# Patient Record
Sex: Female | Born: 1945
Health system: Southern US, Community
[De-identification: ages and names within clinical notes are randomized; demographics above are authoritative.]

## PROBLEM LIST (undated history)

## (undated) DIAGNOSIS — K5904 Chronic idiopathic constipation: Secondary | ICD-10-CM

## (undated) HISTORY — PX: TONSILLECTOMY: SUR1361

## (undated) HISTORY — PX: CHOLECYSTECTOMY: SHX55

## (undated) HISTORY — PX: VAGINAL HYSTERECTOMY: SUR661

## (undated) NOTE — Progress Notes (Signed)
 Formatting of this note might be different from the original. SUBJECTIVE:  Kaylee Costa is a 55 year old female who complains of sinus and nasal congestion, sore throat, nasal blockage, post nasal drip and bilateral sinus pain for 7 days, worse on the right. She had URI symptoms when she flew here from [place]  last week and has had increasing facial pain and ear pain since. She denies a history of wheezing and shortness of breath and denies a history of asthma. Patient denies smoking cigarettes. Concerned about flying again in a few days.  PMH: negative for significantly.  PSH: vag hysterectomy, cholecystectomy, tonsillectomy.  Comprehensive review of systems is negative except as noted in HPI.   OBJECTIVE: Pulse 88  Temp(Src) 99 F (37.2 C) (Temporal)  SpO2 97%  She appears well.  Ears normal.  Throat and pharynx normal.  Neck supple. No adenopathy in the neck. Nose is congested. Sinuses with right maxillary tenderness. The chest is clear, without wheezes or rales.  ASSESSMENT:  (J01.00) Acute maxillary sinusitis, recurrence not specified  (primary encounter diagnosis Plan: amoxicillin-clavulanate (AUGMENTIN) 875-125 mg        tablet, Benzonatate (TESSALON) 200 mg capsule  Discussed saline irrigation, decongestants for flying to help equalize pressure in the sinuses and ears. Electronically signed by Rod Ozell DELENA, PA-C at 12/29/2014  4:01 PM PST

---

## 2011-03-24 DIAGNOSIS — Z85828 Personal history of other malignant neoplasm of skin: Secondary | ICD-10-CM | POA: Insufficient documentation

## 2012-03-02 ENCOUNTER — Emergency Department (INDEPENDENT_AMBULATORY_CARE_PROVIDER_SITE_OTHER): Payer: Worker's Compensation

## 2012-03-02 ENCOUNTER — Encounter (HOSPITAL_BASED_OUTPATIENT_CLINIC_OR_DEPARTMENT_OTHER): Payer: Self-pay | Admitting: Emergency Medicine

## 2012-03-02 ENCOUNTER — Emergency Department (HOSPITAL_BASED_OUTPATIENT_CLINIC_OR_DEPARTMENT_OTHER)
Admission: EM | Admit: 2012-03-02 | Discharge: 2012-03-02 | Disposition: A | Discharge status: Home / Self Care | Payer: Worker's Compensation | Attending: Emergency Medicine | Admitting: Emergency Medicine

## 2012-03-02 DIAGNOSIS — M25519 Pain in unspecified shoulder: Secondary | ICD-10-CM

## 2012-03-02 DIAGNOSIS — M25559 Pain in unspecified hip: Secondary | ICD-10-CM

## 2012-03-02 DIAGNOSIS — M949 Disorder of cartilage, unspecified: Secondary | ICD-10-CM

## 2012-03-02 DIAGNOSIS — S7000XA Contusion of unspecified hip, initial encounter: Secondary | ICD-10-CM | POA: Insufficient documentation

## 2012-03-02 DIAGNOSIS — W010XXA Fall on same level from slipping, tripping and stumbling without subsequent striking against object, initial encounter: Secondary | ICD-10-CM | POA: Insufficient documentation

## 2012-03-02 DIAGNOSIS — W19XXXA Unspecified fall, initial encounter: Secondary | ICD-10-CM

## 2012-03-02 DIAGNOSIS — M899 Disorder of bone, unspecified: Secondary | ICD-10-CM

## 2012-03-02 DIAGNOSIS — S40019A Contusion of unspecified shoulder, initial encounter: Secondary | ICD-10-CM

## 2012-03-02 NOTE — ED Notes (Signed)
Pt was at work yesterday afternoon when she stepped backward catching her foot on carpet, lost her balance and fell.  Landed on right side injuring her right shoulder and right hip.  Finished out the day working. Increased pain today.

## 2012-03-02 NOTE — ED Provider Notes (Signed)
History     CSN: [id number]  Arrival date & time 03/02/12  1750   First MD Initiated Contact with Patient 03/02/12 1856      Chief Complaint  Patient presents with  . Fall    (Consider location/radiation/quality/duration/timing/severity/associated sxs/prior treatment) Patient is a 66 y.o. female presenting with fall. The history is provided by the patient.  Fall The accident occurred yesterday. The fall occurred while standing (at work). She fell from a height of 1 to 2 ft. She landed on a hard floor. There was no blood loss. The point of impact was the right shoulder and right hip. The pain is present in the right shoulder and right hip. The pain is moderate. She was ambulatory at the scene. The symptoms are aggravated by activity, standing, ambulation and rotation. She has tried nothing for the symptoms.    History reviewed. No pertinent past medical history.  Past Surgical History  Procedure Date  . Vaginal hysterectomy   . Cholecystectomy   . Tonsillectomy     No family history on file.  History  Substance Use Topics  . Smoking status: Former Games developer  . Smokeless tobacco: Not on file  . Alcohol Use: Yes     socially    OB History    Grav Para Term Preterm Abortions TAB SAB Ect Mult Living                  Review of Systems  All other systems reviewed and are negative.    Allergies  Morphine and related and Vicodin  Home Medications   Current Outpatient Rx  Name Route Sig Dispense Refill  . IBUPROFEN 200 MG PO TABS Oral Take 200 mg by mouth every 6 (six) hours as needed.      BP 149/70  Pulse 72  Temp(Src) 97.8 F (36.6 C) (Oral)  Resp 16  Ht 5\' 8"  (1.727 m)  Wt 154 lb (69.854 kg)  BMI 23.42 kg/m2  SpO2 99%  Physical Exam  Nursing note and vitals reviewed. Constitutional: She is oriented to person, place, and time. She appears well-developed and well-nourished. No distress.  HENT:  Head: Normocephalic and atraumatic.  Neck: Normal range of  motion. Neck supple.  Musculoskeletal:       The right shoulder appears grossly normal.  There is ttp over the lateral aspect.  Pain is with abduction and internal rotation.  The RUE is intact neurovascularly.    There is an ecchymotic area over the lateral aspect of the right hip, but otherwise appears okay.  Neurovasc intact and no shortening or external rotation.  Neurological: She is alert and oriented to person, place, and time.  Skin: Skin is warm and dry. She is not diaphoretic.    ED Course  Procedures (including critical care time)  Labs Reviewed - No data to display No results found.   No diagnosis found.    MDM          Geoffery Lyons, MD 03/02/12 [phone]

## 2012-03-02 NOTE — ED Notes (Signed)
C/o pain in right shoulder and right upper thigh. Patient put ice on leg which reduced the swelling. Increased pain today, pt states that she has decreased range of motion in her shoulder.

## 2012-03-02 NOTE — Discharge Instructions (Signed)

## 2013-11-29 DIAGNOSIS — K5909 Other constipation: Secondary | ICD-10-CM | POA: Insufficient documentation

## 2016-03-01 DIAGNOSIS — R200 Anesthesia of skin: Secondary | ICD-10-CM | POA: Insufficient documentation

## 2016-03-01 DIAGNOSIS — R4182 Altered mental status, unspecified: Secondary | ICD-10-CM | POA: Insufficient documentation

## 2016-03-01 DIAGNOSIS — M5417 Radiculopathy, lumbosacral region: Secondary | ICD-10-CM | POA: Insufficient documentation

## 2016-04-21 DIAGNOSIS — S32040D Wedge compression fracture of fourth lumbar vertebra, subsequent encounter for fracture with routine healing: Secondary | ICD-10-CM | POA: Insufficient documentation

## 2016-04-21 DIAGNOSIS — M5416 Radiculopathy, lumbar region: Secondary | ICD-10-CM | POA: Insufficient documentation

## 2016-12-13 DIAGNOSIS — L409 Psoriasis, unspecified: Secondary | ICD-10-CM | POA: Insufficient documentation

## 2018-01-22 ENCOUNTER — Emergency Department (HOSPITAL_BASED_OUTPATIENT_CLINIC_OR_DEPARTMENT_OTHER): Payer: Medicare Other

## 2018-01-22 ENCOUNTER — Encounter (HOSPITAL_BASED_OUTPATIENT_CLINIC_OR_DEPARTMENT_OTHER): Payer: Self-pay | Admitting: Emergency Medicine

## 2018-01-22 ENCOUNTER — Emergency Department (HOSPITAL_BASED_OUTPATIENT_CLINIC_OR_DEPARTMENT_OTHER)
Admission: EM | Admit: 2018-01-22 | Discharge: 2018-01-22 | Disposition: A | Discharge status: Home / Self Care | Payer: Medicare Other | Attending: Emergency Medicine | Admitting: Emergency Medicine

## 2018-01-22 ENCOUNTER — Other Ambulatory Visit: Payer: Self-pay

## 2018-01-22 DIAGNOSIS — Z79899 Other long term (current) drug therapy: Secondary | ICD-10-CM | POA: Insufficient documentation

## 2018-01-22 DIAGNOSIS — R0602 Shortness of breath: Secondary | ICD-10-CM | POA: Insufficient documentation

## 2018-01-22 DIAGNOSIS — R42 Dizziness and giddiness: Secondary | ICD-10-CM | POA: Diagnosis not present

## 2018-01-22 DIAGNOSIS — Z87891 Personal history of nicotine dependence: Secondary | ICD-10-CM | POA: Insufficient documentation

## 2018-01-22 DIAGNOSIS — R0789 Other chest pain: Secondary | ICD-10-CM

## 2018-01-22 HISTORY — DX: Chronic idiopathic constipation: K59.04

## 2018-01-22 LAB — COMPREHENSIVE METABOLIC PANEL
ALBUMIN: 3.6 g/dL (ref 3.5–5.0)
ALT: 15 U/L (ref 14–54)
ANION GAP: 7 (ref 5–15)
AST: 23 U/L (ref 15–41)
Alkaline Phosphatase: 70 U/L (ref 38–126)
BUN: 16 mg/dL (ref 6–20)
CO2: 28 mmol/L (ref 22–32)
Calcium: 9 mg/dL (ref 8.9–10.3)
Chloride: 106 mmol/L (ref 101–111)
Creatinine, Ser: 0.6 mg/dL (ref 0.44–1.00)
GFR calc non Af Amer: >60 mL/min (ref >60)
GLUCOSE: 89 mg/dL (ref 65–99)
POTASSIUM: 4.4 mmol/L (ref 3.5–5.1)
Sodium: 141 mmol/L (ref 135–145)
TOTAL PROTEIN: 6.4 g/dL — AB (ref 6.5–8.1)
Total Bilirubin: 0.4 mg/dL (ref 0.3–1.2)

## 2018-01-22 LAB — CBC WITH DIFFERENTIAL/PLATELET
BASOS ABS: 0 10*3/uL (ref 0.0–0.1)
Basophils Relative: 0 %
EOS PCT: 1 %
Eosinophils Absolute: 0.1 10*3/uL (ref 0.0–0.7)
HCT: 40.7 % (ref 36.0–46.0)
Hemoglobin: 13.4 g/dL (ref 12.0–15.0)
LYMPHS PCT: 13 %
Lymphs Abs: 0.9 10*3/uL (ref 0.7–4.0)
MCH: 31.6 pg (ref 26.0–34.0)
MCHC: 32.9 g/dL (ref 30.0–36.0)
MCV: 96 fL (ref 78.0–100.0)
MONO ABS: 0.6 10*3/uL (ref 0.1–1.0)
MONOS PCT: 9 %
Neutro Abs: 5.9 10*3/uL (ref 1.7–7.7)
Neutrophils Relative %: 77 %
PLATELETS: 261 10*3/uL (ref 150–400)
RBC: 4.24 MIL/uL (ref 3.87–5.11)
RDW: 12.5 % (ref 11.5–15.5)
WBC: 7.6 10*3/uL (ref 4.0–10.5)

## 2018-01-22 LAB — D-DIMER, QUANTITATIVE: D-Dimer, Quant: 0.97 ug/mL-FEU — ABNORMAL HIGH (ref 0.00–0.50)

## 2018-01-22 MED ORDER — IOPAMIDOL (ISOVUE-370) INJECTION 76%
100.0000 mL | Freq: Once | INTRAVENOUS | Status: AC | PRN
  Administered 2018-01-22: 100 mL via INTRAVENOUS

## 2018-01-22 NOTE — ED Notes (Signed)
Patient transported to X-ray 

## 2018-01-22 NOTE — ED Provider Notes (Signed)
[facility] EMERGENCY DEPARTMENT Provider Note   CSN: [id number] Arrival date & time: 01/22/18  1050     History   Chief Complaint Chief Complaint  Patient presents with  . Chest Pain    HPI Kaylee Costa is a 72 y.o. female.  The history is provided by the patient.  Chest Pain   This is a new problem. Episode onset: 6 days. The problem occurs constantly. The problem has not changed since onset.Associated with: worse with movement, deep breathing, laughing, coughing. The pain is present in the lateral region (right lower and lateral ribs). The pain is at a severity of 8/10. The pain is severe. The quality of the pain is described as pleuritic and sharp. The pain does not radiate. Duration of episode(s) is 6 days. The symptoms are aggravated by certain positions and deep breathing. Associated symptoms include dizziness and shortness of breath. Pertinent negatives include no abdominal pain, no back pain, no cough, no diaphoresis, no exertional chest pressure, no fever, no headaches, no irregular heartbeat, no leg pain, no lower extremity edema, no nausea, no orthopnea, no palpitations, no sputum production, no vomiting and no weakness. She has tried rest (NSAIDS) for the symptoms. The treatment provided no relief. There are no known risk factors. Past medical history comments: no significant PMH.  no recent travel and no hormone therapy    Past Medical History:  Diagnosis Date  . Chronic idiopathic constipation     There are no active problems to display for this patient.   Past Surgical History:  Procedure Laterality Date  . CHOLECYSTECTOMY    . TONSILLECTOMY    . VAGINAL HYSTERECTOMY      OB History    No data available       Home Medications    Prior to Admission medications   Medication Sig Start Date End Date Taking? Authorizing Provider  linaclotide (LINZESS) 145 MCG CAPS capsule Take 145 mcg by mouth daily before breakfast.   Yes [provider]  ibuprofen (ADVIL,MOTRIN) 200 MG tablet Take 200 mg by mouth every 6 (six) hours as needed. Patient used this medication for the pain in her shoulder.    [provider]    Family History No family history on file.  Social History Social History   Tobacco Use  . Smoking status: Former Games developer  . Smokeless tobacco: Never Used  Substance Use Topics  . Alcohol use: Yes    Comment: socially  . Drug use: No     Allergies   Morphine and related and Vicodin [hydrocodone-acetaminophen]   Review of Systems Review of Systems  Constitutional: Negative for diaphoresis and fever.  Respiratory: Positive for shortness of breath. Negative for cough and sputum production.   Cardiovascular: Positive for chest pain. Negative for palpitations and orthopnea.  Gastrointestinal: Negative for abdominal pain, nausea and vomiting.  Musculoskeletal: Negative for back pain.  Neurological: Positive for dizziness. Negative for weakness and headaches.  All other systems reviewed and are negative.    Physical Exam Updated Vital Signs BP 134/80   Pulse 70   Temp 97.9 F (36.6 C) (Oral)   Resp 16   Ht 5\' 7"  (1.702 m)   Wt 68.9 kg (152 lb)   SpO2 98%   BMI 23.81 kg/m   Physical Exam  Constitutional: She is oriented to person, place, and time. She appears well-developed and well-nourished. No distress.  HENT:  Head: Normocephalic and atraumatic.  Mouth/Throat: Oropharynx is clear and moist.  Eyes:  Conjunctivae and EOM are normal. Pupils are equal, round, and reactive to light.  Neck: Normal range of motion. Neck supple.  Cardiovascular: Normal rate, regular rhythm and intact distal pulses.  No murmur heard. Pulmonary/Chest: Effort normal. No respiratory distress. She has decreased breath sounds. She has no wheezes. She has no rales. She exhibits tenderness and bony tenderness. She exhibits no crepitus and no deformity.  Decreased breath sounds throughout due to not taking deep  breaths due to pain    Abdominal: Soft. She exhibits no distension. There is no tenderness. There is no rebound and no guarding.  Musculoskeletal: Normal range of motion. She exhibits no edema or tenderness.  Neurological: She is alert and oriented to person, place, and time.  Skin: Skin is warm and dry. No rash noted. No erythema.  Psychiatric: She has a normal mood and affect. Her behavior is normal.  Nursing note and vitals reviewed.    ED Treatments / Results  Labs (all labs ordered are listed, but only abnormal results are displayed) Labs Reviewed  D-DIMER, QUANTITATIVE (NOT AT [facility]) - Abnormal; Notable for the following [facility]:      Result Value   D-Dimer, Quant 0.97 (*)    All other components within normal limits  COMPREHENSIVE METABOLIC PANEL - Abnormal; Notable for the following components:   Total Protein 6.4 (*)    All other components within normal limits  CBC WITH DIFFERENTIAL/PLATELET    EKG  EKG Interpretation  Date/Time:  Sunday January 22 2018 11:04:17 EDT Ventricular Rate:  73 PR Interval:    QRS Duration: 97 QT Interval:  403 QTC Calculation: 445 R Axis:   74 Text Interpretation:  Sinus rhythm Minimal ST depression, inferior leads Baseline wander in lead(s) I II III aVR aVL aVF No previous tracing Confirmed by Gwyneth SproutPlunkett, Reinhold Rickey ([id number]) on 01/22/2018 11:14:22 AM       Radiology Dg Chest 2 View  Result Date: 01/22/2018 CLINICAL DATA:  Chest pain EXAM: CHEST - 2 VIEW COMPARISON:  None. FINDINGS: Left base atelectasis. Right lung clear. Heart is normal size. No effusions or acute bony abnormality. IMPRESSION: Left base atelectasis. Electronically Signed   By: Charlett NoseKevin  Dover M.D.   On: 01/22/2018 11:23   Ct Angio Chest Pe W And/or Wo Contrast  Result Date: 01/22/2018 CLINICAL DATA:  Intermediate probability for pulmonary embolism. Right rib pain. Elevated D-dimer. EXAM: CT ANGIOGRAPHY CHEST WITH CONTRAST TECHNIQUE: Multidetector CT imaging of the chest  was performed using the standard protocol during bolus administration of intravenous contrast. Multiplanar CT image reconstructions and MIPs were obtained to evaluate the vascular anatomy. CONTRAST:  100mL ISOVUE-370 IOPAMIDOL (ISOVUE-370) INJECTION 76% COMPARISON:  None. FINDINGS: Cardiovascular: Satisfactory opacification of the pulmonary arteries to the segmental level. No evidence of pulmonary embolism. Normal heart size. No pericardial effusion. Mediastinum/Nodes: Negative for adenopathy or mass Lungs/Pleura: Mild atelectasis at the bases. There is no edema, consolidation, effusion, or pneumothorax. Upper Abdomen: Punctate stone in the upper pole left kidney. Musculoskeletal: No acute or aggressive finding Review of the MIP images confirms the above findings. IMPRESSION: 1. Negative for pulmonary embolism. 2. Mild atelectasis at the bases. 3. Punctate left renal calculus. Electronically Signed   By: Marnee SpringJonathon  Watts M.D.   On: 01/22/2018 14:02    Procedures Procedures (including critical care time)  Medications Ordered in ED Medications - No data to display   Initial Impression / Assessment and Plan / ED Course  I have reviewed the triage vital signs and the nursing notes.  Pertinent  labs & imaging results that were available during my care of the patient were reviewed by me and considered in my medical decision making (see chart for details).     Patient is a 72 year old female with no significant past medical history presenting today with pleuritic type sharp chest pain that is in the right lateral and lower ribs.  It is worse with coughing, deep breathing, laughing and movements.  She does not associate any pain with eating she is not been systemically ill and denies cough, fever.  She is having some shortness of breath which will occasionally make her feel dizzy.  She denies any new medications.  She cannot recall any specific trauma and she has not noted any rashes.  There is no evidence of  zoster on exam.  Breath sounds are decreased throughout but feel that is due to effort.  Vital signs are reassuring and oxygen saturation is 98% on room air.  Patient's EKG with nonspecific changes and no old to compare however low suspicion this is ACS or dissection.  He has no right upper quadrant tenderness or concern for cholecystitis at this time.  Patient is low risk [place] so we will do a d-dimer.  Chest x-ray without evidence of cardiomegaly, pneumothorax but she does have some atelectasis. CBC, CMP, d-dimer pending.  Patient offered pain control and she refused.  2:18 PM D-dimer was elevated and rest of labs within normal limits.  CT was negative for PE.  Patient was discharged home with musculoskeletal pain  Final Clinical Impressions(s) / ED Diagnoses   Final diagnoses:  Chest wall pain    ED Discharge Orders    None       Gwyneth Sprout, MD 01/22/18 1419

## 2018-01-22 NOTE — ED Notes (Signed)
Patient in CT

## 2018-01-22 NOTE — ED Notes (Signed)
Water given with verbal approval from Dr. Tanna SavoyPlunket

## 2018-01-22 NOTE — ED Triage Notes (Signed)
R side chest/rib pain x 1 week. Pain increases with movement and inspiration.

## 2018-03-09 DIAGNOSIS — I781 Nevus, non-neoplastic: Secondary | ICD-10-CM | POA: Insufficient documentation

## 2018-03-27 DIAGNOSIS — E559 Vitamin D deficiency, unspecified: Secondary | ICD-10-CM | POA: Insufficient documentation

## 2018-04-12 ENCOUNTER — Emergency Department (HOSPITAL_BASED_OUTPATIENT_CLINIC_OR_DEPARTMENT_OTHER): Payer: Medicare Other

## 2018-04-12 ENCOUNTER — Encounter (HOSPITAL_BASED_OUTPATIENT_CLINIC_OR_DEPARTMENT_OTHER): Payer: Self-pay | Admitting: Adult Health

## 2018-04-12 ENCOUNTER — Emergency Department (HOSPITAL_BASED_OUTPATIENT_CLINIC_OR_DEPARTMENT_OTHER)
Admission: EM | Admit: 2018-04-12 | Discharge: 2018-04-12 | Disposition: A | Discharge status: Home / Self Care | Payer: Medicare Other | Attending: Emergency Medicine | Admitting: Emergency Medicine

## 2018-04-12 ENCOUNTER — Other Ambulatory Visit: Payer: Self-pay

## 2018-04-12 DIAGNOSIS — Z87891 Personal history of nicotine dependence: Secondary | ICD-10-CM | POA: Diagnosis not present

## 2018-04-12 DIAGNOSIS — Z79899 Other long term (current) drug therapy: Secondary | ICD-10-CM | POA: Insufficient documentation

## 2018-04-12 DIAGNOSIS — M25551 Pain in right hip: Secondary | ICD-10-CM

## 2018-04-12 DIAGNOSIS — R52 Pain, unspecified: Secondary | ICD-10-CM

## 2018-04-12 NOTE — Discharge Instructions (Signed)
Imaging shows no signs of hip fracture.  This could be a possible tendon injury or labral tear.  Would recommend following with orthopedics.  Use crutches for weightbearing as tolerated.  May use lidocaine patches.  Recommend taking anti-inflammatories.  Perform range of motion.  It is very important for you to follow-up with orthopedics.  Return to ED if you develop any loss of bowel or bladder, not able to urinate, numbness in her vaginal area, worsening pain.

## 2018-04-12 NOTE — ED Triage Notes (Signed)
Presents with right hip pain. This morning she was playing golf and in md swing heard a pop and had onset of r hip pain. She is ambulatory, alternating heat and ice and took 3 ibuprofen.

## 2018-04-12 NOTE — ED Provider Notes (Signed)
[facility] EMERGENCY DEPARTMENT Provider Note   CSN: [id number] Arrival date & time: 04/12/18  1636     History   Chief Complaint Chief Complaint  Patient presents with  . Hip Pain    HPI Kaylee Costa is a 72 y.o. female.  HPI  72 year old female with medical history significant for osteopenia presents to the emergency department today with complaints of right hip pain.  Patient states that she was playing golf this morning when she went to make a swing and had immediate pain with a pop in her right hip.  Patient states that she had to "limp" back to the golf cart.  She has been able to weight-bear however ambulation does cause her significant pain.  Pain is worse with ambulation and palpation.  She took 3 Advil this morning.  States that sitting down and not moving makes the pain better.  Denies any associated paresthesias.  Denies any weakness.  She denies any red flag symptoms including history of IV drug use, history of cancer, saddle paresthesias, loss of bowel bladder, urinary retention or lower extremity paresthesias.  Denies any associated back pain.  Pain is localized to the right hip area.     Past Medical History:  Diagnosis Date  . Chronic idiopathic constipation     There are no active problems to display for this patient.   Past Surgical History:  Procedure Laterality Date  . CHOLECYSTECTOMY    . TONSILLECTOMY    . VAGINAL HYSTERECTOMY       OB History   None      Home Medications    Prior to Admission medications   Medication Sig Start Date End Date Taking? Authorizing Provider  ibuprofen (ADVIL,MOTRIN) 200 MG tablet Take 200 mg by mouth every 6 (six) hours as needed. Patient used this medication for the pain in her shoulder.    [provider]  linaclotide (LINZESS) 145 MCG CAPS capsule Take 145 mcg by mouth daily before breakfast.    [provider]    Family History History reviewed. No pertinent family  history.  Social History Social History   Tobacco Use  . Smoking status: Former Games developer  . Smokeless tobacco: Never Used  Substance Use Topics  . Alcohol use: Yes    Comment: socially  . Drug use: No     Allergies   Morphine and related and Vicodin [hydrocodone-acetaminophen]   Review of Systems Review of Systems  All other systems reviewed and are negative.    Physical Exam Updated Vital Signs BP (!) 147/80   Pulse 76   Temp 98.5 F (36.9 C) (Oral)   Resp 18   SpO2 95%   Physical Exam  Constitutional: She appears well-developed and well-nourished. No distress.  HENT:  Head: Normocephalic and atraumatic.  Eyes: Right eye exhibits no discharge. Left eye exhibits no discharge. No scleral icterus.  Neck: Normal range of motion.  Pulmonary/Chest: No respiratory distress.  Musculoskeletal: Normal range of motion.       Right hip: She exhibits tenderness and bony tenderness. She exhibits normal range of motion, normal strength, no swelling, no crepitus, no deformity and no laceration.       Legs: There is no lower extremity rotation or shortening.  She does have some mild pain with logroll the right leg.  Patient has full range of motion with passive and active range of motion of the right hip and right knee.  No clicking sound made with rotation of the right  hip.  No obvious deformity noted.  She has no tenderness along the lumbar spine.  No significant paraspinal tenderness.  Skin compartments are soft.  DP pulses are 2+ bilaterally.  Sensation intact.  Brisk cap refill.  Neurological: She is alert.  Skin: Skin is warm and dry. Capillary refill takes less than 2 seconds. No pallor.  Psychiatric: Her behavior is normal. Judgment and thought content normal.  Nursing note and vitals reviewed.    ED Treatments / Results  Labs (all labs ordered are listed, but only abnormal results are displayed) Labs Reviewed - No data to display  EKG None  Radiology Ct Hip Right  Wo Contrast  Result Date: 04/12/2018 CLINICAL DATA:  Right hip pain.  Heard a pop while playing golf. EXAM: CT OF THE RIGHT HIP WITHOUT CONTRAST TECHNIQUE: Multidetector CT imaging of the right hip was performed according to the standard protocol. Multiplanar CT image reconstructions were also generated. COMPARISON:  None. FINDINGS: Bones/Joint/Cartilage No fracture or dislocation. Normal alignment. No joint effusion. Mild osteoarthritis of right SI joint. Severe degenerative disc disease with disc height loss at L5-S1. 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 Ligaments Ligaments are suboptimally evaluated by CT. Muscles and Tendons Muscles are normal. No muscle atrophy. No intramuscular fluid collection or hematoma. Soft tissue No fluid collection or hematoma. No soft tissue mass. Diverticulosis without evidence of diverticulitis. Small fat containing umbilical hernia. IMPRESSION: 1.  No acute osseous injury of the right hip. 2. 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 Electronically Signed   By: Elige Ko   On: 04/12/2018 19:37   Dg Hip Unilat With Pelvis 2-3 Views Right  Result Date: 04/12/2018 CLINICAL DATA:  Right hip pain. EXAM: DG HIP (WITH OR WITHOUT PELVIS) 2-3V RIGHT COMPARISON:  None. FINDINGS: There is no evidence of hip fracture or dislocation. There is no evidence of arthropathy or other focal bone abnormality. IMPRESSION: Negative. Electronically Signed   By: Elige Ko   On: 04/12/2018 17:54    Procedures Procedures (including critical care time)  Medications Ordered in ED Medications - No data to display   Initial Impression / Assessment and Plan / ED Course  I have reviewed the triage vital signs and the nursing notes.  Pertinent labs & imaging results that were available during my care of the patient were reviewed by me and considered in my medical decision making (see chart for details).     Presents to the ED for acute onset of right hip pain after feeling a pop  while golfing this morning.  Neurovascularly intact.  She is able to ambulate and stand on her right leg but does cause her some pain.  Patient skin compartments are soft.  The pain is localized to the right hip.  No low back pain.  No sciatic nerve pain.  No red flag symptoms that be concerning for cauda equina.  X-ray of the right hip was reassuring.  Given patient's pain with ambulation CT scan was performed that showed no acute findings.  I suspect possible ligamentous or tendon injury versus labral tear.  Possibly occult fracture however CT scan was reassuring.  Patient does have an orthopedic doctor and will try to see them tomorrow.  Given her crutches for nonweightbearing.  She does not want any pain medication to go home with.  She states that she has ibuprofen and Tylenol at home.  Will use lidocaine patches.  Patient remains neurovascularly intact.  Pt is hemodynamically stable, in NAD, & able to ambulate  in the ED. Evaluation does not show pathology that would require ongoing emergent intervention or inpatient treatment. I explained the diagnosis to the patient. Pain has been managed & has no complaints prior to dc. Pt is comfortable with above plan and is stable for discharge at this time. All questions were answered prior to disposition. Strict return precautions for f/u to the ED were discussed. Encouraged follow up with PCP.  PT was dicussed with Dr. Jacqulyn Bath who is agreeable with the above plan.   Final Clinical Impressions(s) / ED Diagnoses   Final diagnoses:  Right hip pain    ED Discharge Orders    None       Wallace Keller 04/12/18 1946    Long, Arlyss Repress, MD 04/13/18 1334

## 2018-04-13 DIAGNOSIS — G629 Polyneuropathy, unspecified: Secondary | ICD-10-CM | POA: Insufficient documentation

## 2018-04-13 DIAGNOSIS — M810 Age-related osteoporosis without current pathological fracture: Secondary | ICD-10-CM | POA: Insufficient documentation

## 2018-04-13 DIAGNOSIS — M1990 Unspecified osteoarthritis, unspecified site: Secondary | ICD-10-CM | POA: Insufficient documentation

## 2018-04-13 DIAGNOSIS — M7061 Trochanteric bursitis, right hip: Secondary | ICD-10-CM | POA: Insufficient documentation

## 2018-08-06 ENCOUNTER — Emergency Department (HOSPITAL_BASED_OUTPATIENT_CLINIC_OR_DEPARTMENT_OTHER): Payer: Medicare Other

## 2018-08-06 ENCOUNTER — Other Ambulatory Visit: Payer: Self-pay

## 2018-08-06 ENCOUNTER — Emergency Department (HOSPITAL_BASED_OUTPATIENT_CLINIC_OR_DEPARTMENT_OTHER)
Admission: EM | Admit: 2018-08-06 | Discharge: 2018-08-06 | Disposition: A | Discharge status: Home / Self Care | Payer: Medicare Other | Attending: Emergency Medicine | Admitting: Emergency Medicine

## 2018-08-06 ENCOUNTER — Encounter (HOSPITAL_BASED_OUTPATIENT_CLINIC_OR_DEPARTMENT_OTHER): Payer: Self-pay | Admitting: *Deleted

## 2018-08-06 DIAGNOSIS — Y999 Unspecified external cause status: Secondary | ICD-10-CM | POA: Insufficient documentation

## 2018-08-06 DIAGNOSIS — S93602A Unspecified sprain of left foot, initial encounter: Secondary | ICD-10-CM

## 2018-08-06 DIAGNOSIS — Z87891 Personal history of nicotine dependence: Secondary | ICD-10-CM | POA: Insufficient documentation

## 2018-08-06 DIAGNOSIS — Z79899 Other long term (current) drug therapy: Secondary | ICD-10-CM | POA: Insufficient documentation

## 2018-08-06 DIAGNOSIS — Y9389 Activity, other specified: Secondary | ICD-10-CM | POA: Diagnosis not present

## 2018-08-06 DIAGNOSIS — Y929 Unspecified place or not applicable: Secondary | ICD-10-CM | POA: Diagnosis not present

## 2018-08-06 DIAGNOSIS — X500XXA Overexertion from strenuous movement or load, initial encounter: Secondary | ICD-10-CM | POA: Diagnosis not present

## 2018-08-06 DIAGNOSIS — S99922A Unspecified injury of left foot, initial encounter: Secondary | ICD-10-CM | POA: Diagnosis present

## 2018-08-06 MED ORDER — IBUPROFEN 600 MG PO TABS: 600.0000 mg | ORAL_TABLET | Freq: Three times a day (TID) | ORAL | 0 refills | Status: AC | PRN

## 2018-08-06 MED ORDER — IBUPROFEN 800 MG PO TABS
800.0000 mg | ORAL_TABLET | Freq: Once | ORAL | Status: AC
  Administered 2018-08-06: 800 mg via ORAL
  Filled 2018-08-06: qty 1

## 2018-08-06 NOTE — ED Notes (Signed)
PMS intact before and after. Pt tolerated well. All questions answered. 

## 2018-08-06 NOTE — Discharge Instructions (Signed)
1.  At this time your x-ray is not showing any broken bones.  You appear to have a strain of the forefoot.  Keep your foot elevated and iced.  Wear your Ace wrap and either postoperative shoe or a walking boot when you are going to be up moving about. 2.  Schedule follow-up appointment with your orthopedic surgeon for recheck.  Sometimes stress fractures do not show immediately on a plain film x-ray.  If your symptoms are not improving, you may need other imaging as well.

## 2018-08-06 NOTE — ED Provider Notes (Signed)
[facility] EMERGENCY DEPARTMENT Provider Note   CSN: [id number] Arrival date & time: 08/06/18  1854     History   Chief Complaint Chief Complaint  Patient presents with  . Foot Pain    HPI Kaylee Costa is a 72 y.o. female.  HPI Patient had been sitting and eating dinner with her legs crossed.  When they were getting ready to go she realized that she had had her legs crossed the whole time and her left foot was tingly and felt like it was going to sleep.  She was pushing her toes into the floor and stretching the foot and the ankle to get the circulation back in it.  Reports as she was doing that, she felt a bunch of popping in the foot and then had sudden severe pain.  She reports she had a forefoot fracture before and had to wear a postoperative boot for about 6 weeks. Past Medical History:  Diagnosis Date  . Chronic idiopathic constipation     There are no active problems to display for this patient.   Past Surgical History:  Procedure Laterality Date  . CHOLECYSTECTOMY    . TONSILLECTOMY    . VAGINAL HYSTERECTOMY       OB History   None      Home Medications    Prior to Admission medications   Medication Sig Start Date End Date Taking? Authorizing Provider  linaclotide (LINZESS) 145 MCG CAPS capsule Take 145 mcg by mouth daily before breakfast.   Yes [provider]  ibuprofen (ADVIL,MOTRIN) 200 MG tablet Take 200 mg by mouth every 6 (six) hours as needed. Patient used this medication for the pain in her shoulder.    [provider]  ibuprofen (ADVIL,MOTRIN) 600 MG tablet Take 1 tablet (600 mg total) by mouth every 8 (eight) hours as needed. 08/06/18   Arby Barrette, MD    Family History No family history on file.  Social History Social History   Tobacco Use  . Smoking status: Former Games developer  . Smokeless tobacco: Never Used  Substance Use Topics  . Alcohol use: Yes    Comment: socially  . Drug use: No     Allergies     Morphine and related and Vicodin [hydrocodone-acetaminophen]   Review of Systems Review of Systems Constitutional: No fever no chills no recent general illness. Respiratory: No chest pain no shortness of breath  Physical Exam Updated Vital Signs BP (!) 116/110 (BP Location: Left Arm)   Pulse 77   Temp 98.3 F (36.8 C) (Oral)   Resp 20   Ht 5\' 7"  (1.702 m)   Wt 70.3 kg   SpO2 100%   BMI 24.28 kg/m   Physical Exam  Constitutional: She is oriented to person, place, and time. She appears well-developed and well-nourished. No distress.  Pulmonary/Chest: Effort normal.  Musculoskeletal:  Patient has mild swelling of the lateral forefoot on the left over the tarsals.  No swelling over the metatarsals.  No erythema.  Pulses 2+.  Foot is warm and dry.  No pain at the ankle and no effusion at the ankle.  Calf is soft and nontender.  Neurological: She is alert and oriented to person, place, and time. She exhibits normal muscle tone. Coordination normal.  Skin: Skin is warm and dry.  Psychiatric: She has a normal mood and affect.     ED Treatments / Results  Labs (all labs ordered are listed, but only abnormal results are displayed) Labs Reviewed -  No data to display  EKG None  Radiology Dg Foot Complete Left  Result Date: 08/06/2018 CLINICAL DATA:  Left foot pain, swelling EXAM: LEFT FOOT - COMPLETE 3+ VIEW COMPARISON:  None. FINDINGS: There is no evidence of fracture or dislocation. There is no evidence of arthropathy or other focal bone abnormality. Soft tissues are unremarkable. IMPRESSION: Negative. Electronically Signed   By: Charlett NoseKevin  Dover M.D.   On: 08/06/2018 19:32    Procedures Procedures (including critical care time)  Medications Ordered in ED Medications  ibuprofen (ADVIL,MOTRIN) tablet 800 mg (has no administration in time range)     Initial Impression / Assessment and Plan / ED Course  I have reviewed the triage vital signs and the nursing  notes.  Pertinent labs & imaging results that were available during my care of the patient were reviewed by me and considered in my medical decision making (see chart for details).     Patient was stretching her left foot by pressing it into the floor against resistance.  She had sudden popping and severe pain in the forefoot.  Xray does not show any acute fractures.  Patient does have objective mild swelling.  Patient will be placed in Ace wrap and postop shoe.  She will follow-up with her orthopedic provider for further monitoring and evaluation as needed.  Final Clinical Impressions(s) / ED Diagnoses   Final diagnoses:  Sprain of left foot, initial encounter    ED Discharge Orders         Ordered    ibuprofen (ADVIL,MOTRIN) 600 MG tablet  Every 8 hours PRN     08/06/18 2114           Arby BarrettePfeiffer, Melania Kirks, MD 08/06/18 2120

## 2018-08-06 NOTE — ED Triage Notes (Signed)
Pt c/o pain to left foot. States "I think I came down on it wrong". Reports pain with ambulation

## 2019-01-02 DIAGNOSIS — Z872 Personal history of diseases of the skin and subcutaneous tissue: Secondary | ICD-10-CM | POA: Insufficient documentation

## 2019-02-10 IMAGING — CR DG FOOT COMPLETE 3+V*L*
3 series · 3 of 3 positions shown · non-contrast
Comparison: None.

CLINICAL DATA: Left foot pain, swelling

EXAM:
LEFT FOOT - COMPLETE 3+ VIEW

[t foot ap left]
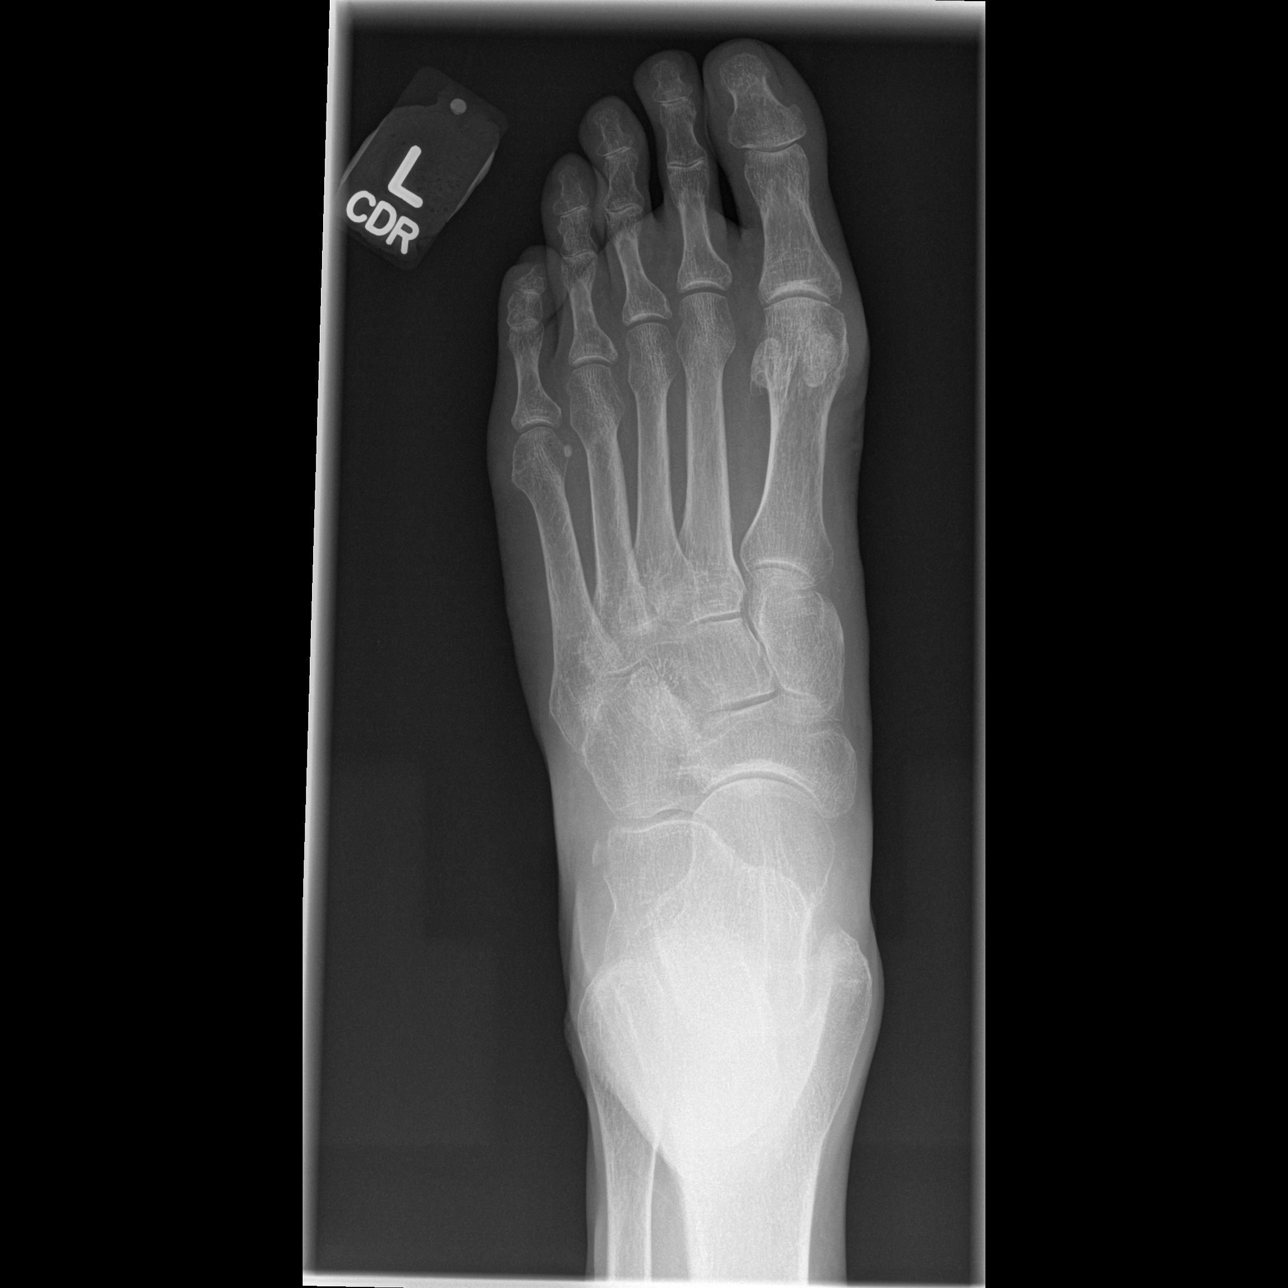

[t foot oblique left]
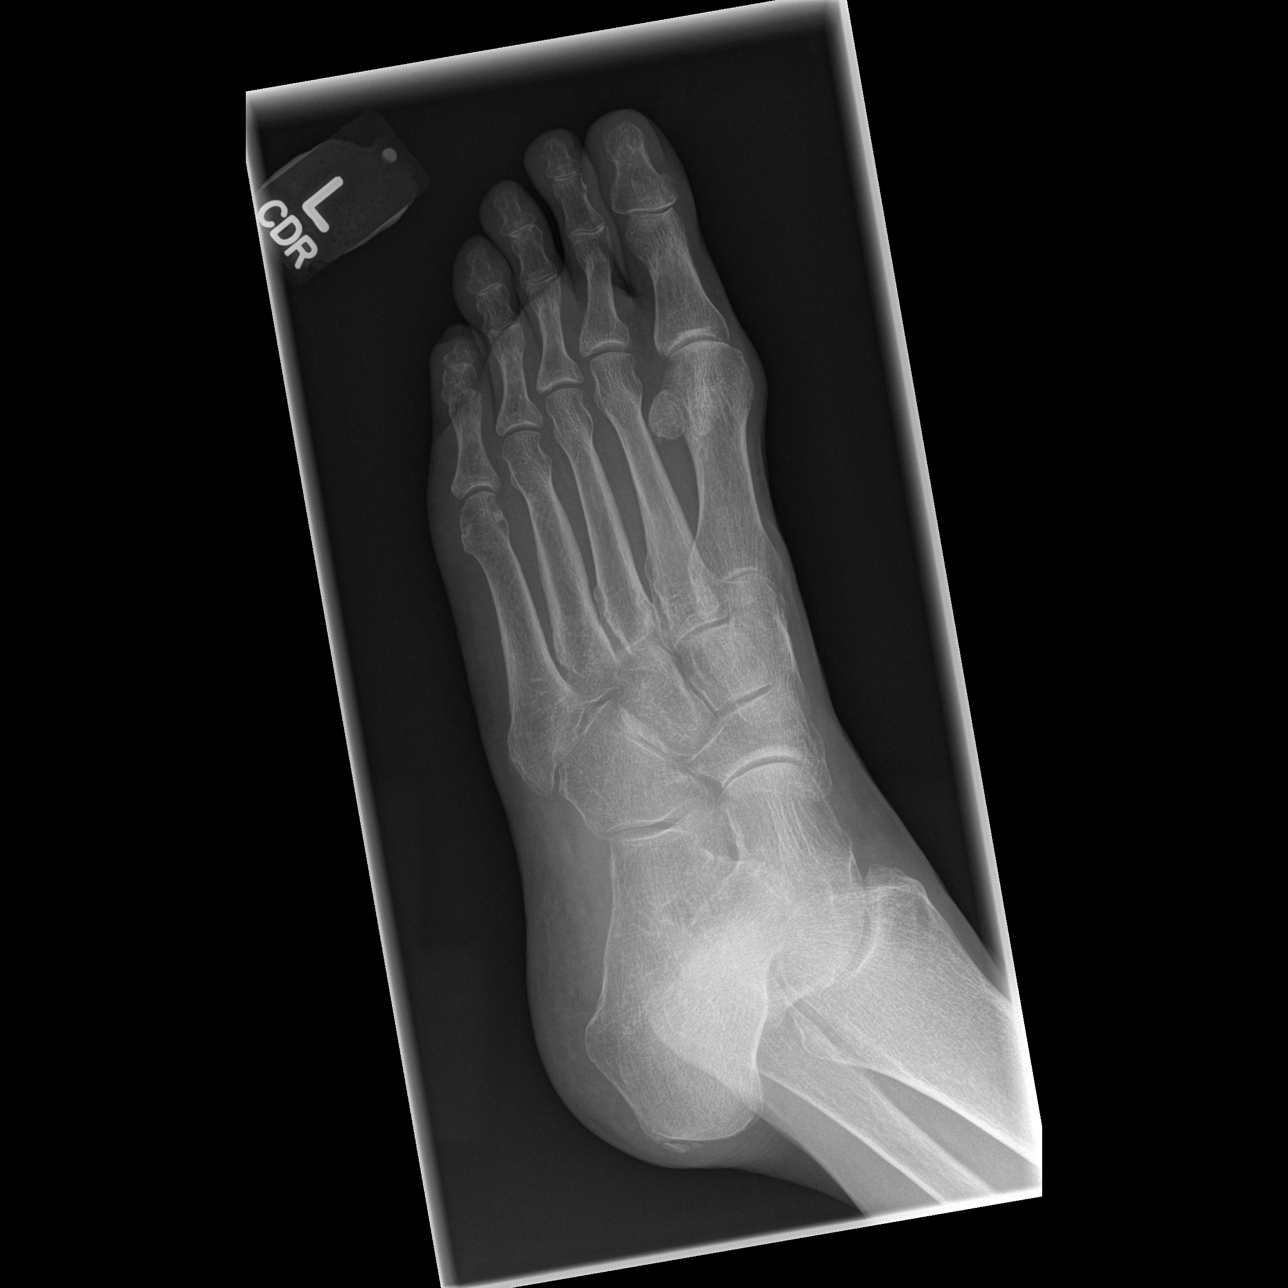

[t foot lat left]
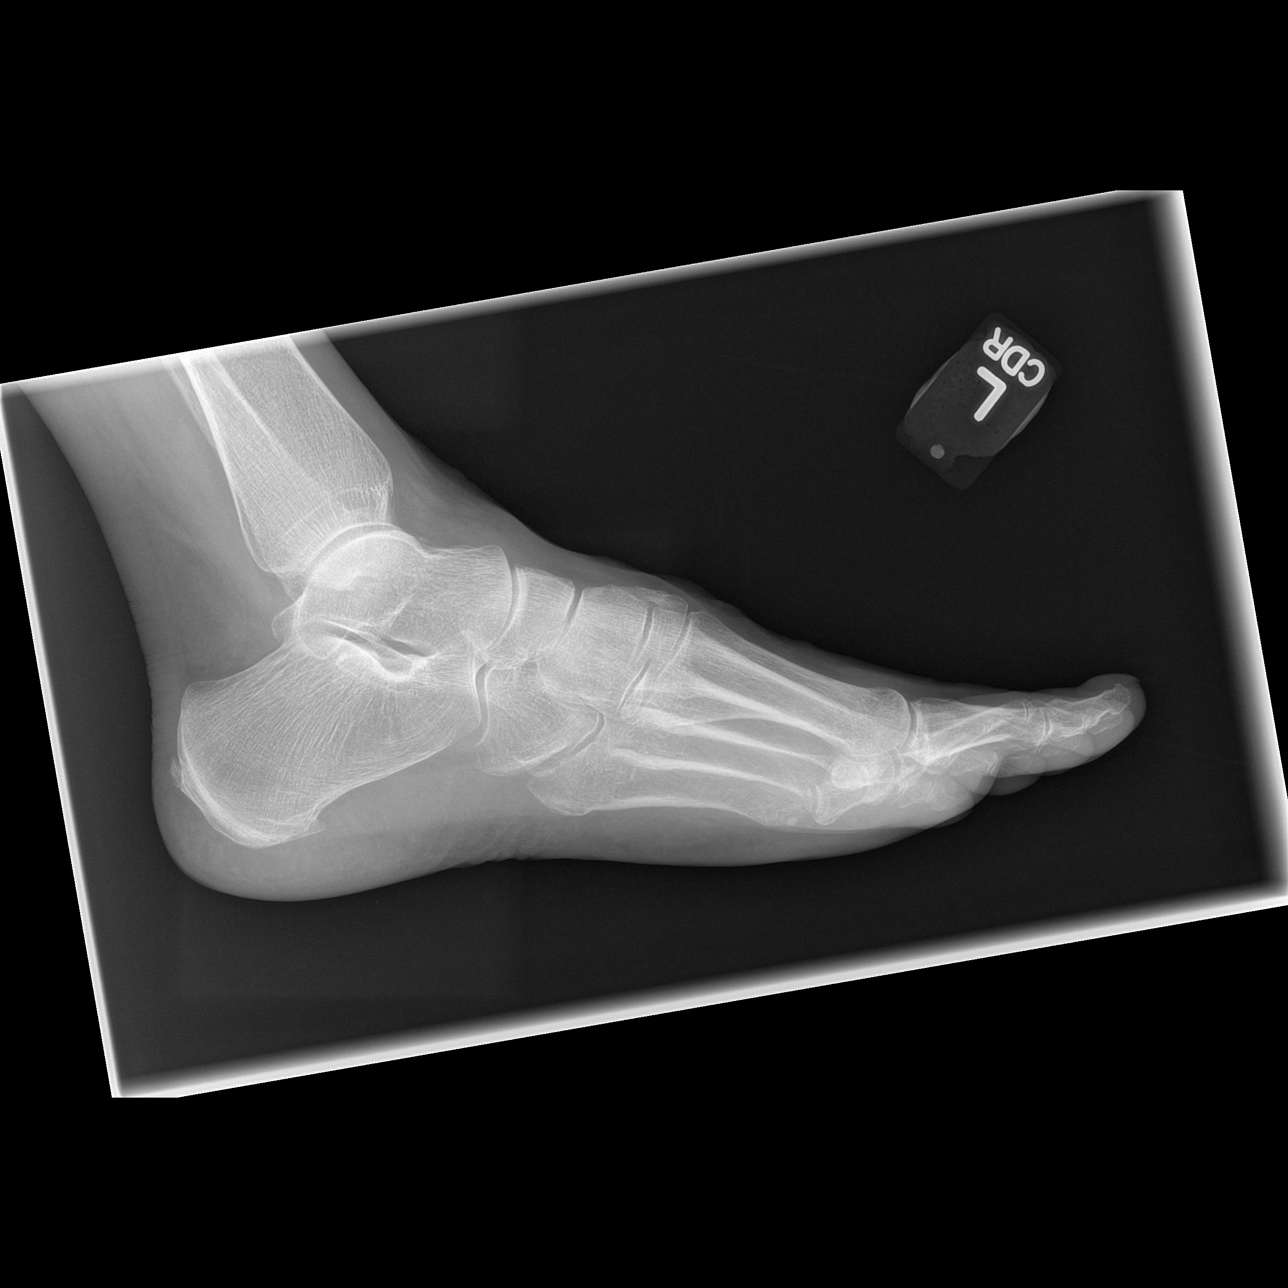

[3 of 3 positions shown; findings below may reference images not displayed]

FINDINGS: There is no evidence of fracture or dislocation. There is no
evidence of arthropathy or other focal bone abnormality. Soft
tissues are unremarkable.
IMPRESSION: Negative.

## 2019-07-13 DIAGNOSIS — E042 Nontoxic multinodular goiter: Secondary | ICD-10-CM | POA: Insufficient documentation

## 2019-12-06 ENCOUNTER — Ambulatory Visit: Payer: Medicare Other | Attending: Internal Medicine

## 2019-12-06 DIAGNOSIS — Z23 Encounter for immunization: Secondary | ICD-10-CM | POA: Insufficient documentation

## 2019-12-06 NOTE — Progress Notes (Signed)
   Covid-19 Vaccination Clinic  Name:  Kaylee Costa    MRN: [id number] DOB: 05/16/1946  12/06/2019  [name] was observed post Covid-19 immunization for 15 minutes without incidence. She was provided with Vaccine Information Sheet and instruction to access the V-Safe system.   [name] was instructed to call 911 with any severe reactions post vaccine: Marland Kitchen Difficulty breathing  . Swelling of your face and throat  . A fast heartbeat  . A bad rash all over your body  . Dizziness and weakness    Immunizations Administered    Name Date Dose VIS Date Route   Pfizer COVID-19 Vaccine 12/06/2019  2:15 PM 0.3 mL 10/26/2019 Intramuscular   Manufacturer: [employer], [employer]   Lot: [id number]   NDC: [address]-7

## 2019-12-27 ENCOUNTER — Ambulatory Visit: Payer: Medicare Other | Attending: Internal Medicine

## 2019-12-27 DIAGNOSIS — Z23 Encounter for immunization: Secondary | ICD-10-CM

## 2019-12-27 NOTE — Progress Notes (Signed)
   Covid-19 Vaccination Clinic  Name:  Catrina Fellenz    MRN: [id number] DOB: 09/03/1946  12/27/2019  Ms. Sylvan was observed post Covid-19 immunization for 15 minutes without incidence. She was provided with Vaccine Information Sheet and instruction to access the V-Safe system.   Ms. Steuck was instructed to call 911 with any severe reactions post vaccine: Marland Kitchen Difficulty breathing  . Swelling of your face and throat  . A fast heartbeat  . A bad rash all over your body  . Dizziness and weakness    Immunizations Administered    Name Date Dose VIS Date Route   Pfizer COVID-19 Vaccine 12/27/2019  9:44 AM 0.3 mL 10/26/2019 Intramuscular   Manufacturer: [employer], [employer]   Lot: [id number]   NDC: [address]-2

## 2020-09-12 ENCOUNTER — Ambulatory Visit: Payer: Medicare Other | Attending: Internal Medicine

## 2020-09-12 ENCOUNTER — Other Ambulatory Visit (HOSPITAL_BASED_OUTPATIENT_CLINIC_OR_DEPARTMENT_OTHER): Payer: Self-pay | Admitting: Internal Medicine

## 2020-09-12 DIAGNOSIS — Z23 Encounter for immunization: Secondary | ICD-10-CM

## 2020-09-12 NOTE — Progress Notes (Signed)
   Covid-19 Vaccination Clinic  Name:  Kaylee Costa    MRN: [id number] DOB: Jul 06, 1946  09/12/2020  [name] was observed post Covid-19 immunization for 15 minutes without incident. She was provided with Vaccine Information Sheet and instruction to access the V-Safe system.   [name] was instructed to call 911 with any severe reactions post vaccine: Marland Kitchen Difficulty breathing  . Swelling of face and throat  . A fast heartbeat  . A bad rash all over body  . Dizziness and weakness

## 2020-09-16 MED FILL — PFIZER-BIONTECH COVID-19 VA: 30 | 1 days supply | Qty: 0 | Fill #0

## 2021-02-26 ENCOUNTER — Ambulatory Visit: Payer: Medicare Other | Attending: Internal Medicine

## 2021-02-26 DIAGNOSIS — Z23 Encounter for immunization: Secondary | ICD-10-CM

## 2021-02-26 NOTE — Progress Notes (Signed)
   Covid-19 Vaccination Clinic  Name:  Kaylee Costa    MRN: [id number] DOB: 1946-01-27  02/26/2021  Kaylee Costa was observed post Covid-19 immunization for 15 minutes without incident. She was provided with Vaccine Information Sheet and instruction to access the V-Safe system.   Kaylee Costa was instructed to call 911 with any severe reactions post vaccine: Marland Kitchen Difficulty breathing  . Swelling of face and throat  . A fast heartbeat  . A bad rash all over body  . Dizziness and weakness   Immunizations Administered    Name Date Dose VIS Date Route   PFIZER Comrnaty(Gray TOP) Covid-19 Vaccine 02/26/2021  9:31 AM 0.3 mL 10/23/2020 Intramuscular   Manufacturer: [employer], [employer]   Lot: [id number]   NDC: [phone]

## 2021-03-02 ENCOUNTER — Other Ambulatory Visit (HOSPITAL_BASED_OUTPATIENT_CLINIC_OR_DEPARTMENT_OTHER): Payer: Self-pay

## 2021-03-02 MED ORDER — PFIZER-BIONT COVID-19 VAC-TRIS 30 MCG/0.3ML IM SUSP
INTRAMUSCULAR | 0 refills | Status: DC
  Filled 2021-03-02: qty 0.3, 1d supply, fill #0

## 2021-07-06 ENCOUNTER — Other Ambulatory Visit (HOSPITAL_BASED_OUTPATIENT_CLINIC_OR_DEPARTMENT_OTHER): Payer: Self-pay

## 2021-07-07 ENCOUNTER — Other Ambulatory Visit (HOSPITAL_BASED_OUTPATIENT_CLINIC_OR_DEPARTMENT_OTHER): Payer: Self-pay

## 2021-09-30 ENCOUNTER — Ambulatory Visit: Payer: Medicare Other | Attending: Internal Medicine

## 2021-09-30 DIAGNOSIS — Z23 Encounter for immunization: Secondary | ICD-10-CM

## 2021-09-30 NOTE — Progress Notes (Signed)
   Covid-19 Vaccination Clinic  Name:  Kaylee Costa    MRN: [id number] DOB: Jun 08, 1946  09/30/2021  [name] was observed post Covid-19 immunization for 15 minutes without incident. She was provided with Vaccine Information Sheet and instruction to access the V-Safe system.   [name] was instructed to call 911 with any severe reactions post vaccine: Difficulty breathing  Swelling of face and throat  A fast heartbeat  A bad rash all over body  Dizziness and weakness   Immunizations Administered     Name Date Dose VIS Date Route   Pfizer Covid-19 Vaccine Bivalent Booster 09/30/2021  9:39 AM 0.3 mL 07/15/2021 Intramuscular   Manufacturer: [employer], [employer]   Lot: [id number]   NDC: [phone]

## 2021-10-16 ENCOUNTER — Other Ambulatory Visit (HOSPITAL_BASED_OUTPATIENT_CLINIC_OR_DEPARTMENT_OTHER): Payer: Self-pay

## 2021-10-16 MED ORDER — PFIZER COVID-19 VAC BIVALENT 30 MCG/0.3ML IM SUSP
INTRAMUSCULAR | 0 refills | Status: DC
  Filled 2021-10-16: qty 0.3, 1d supply, fill #0

## 2022-03-09 DIAGNOSIS — M94261 Chondromalacia, right knee: Secondary | ICD-10-CM | POA: Insufficient documentation

## 2022-03-09 DIAGNOSIS — E785 Hyperlipidemia, unspecified: Secondary | ICD-10-CM | POA: Insufficient documentation

## 2022-03-09 DIAGNOSIS — H9011 Conductive hearing loss, unilateral, right ear, with unrestricted hearing on the contralateral side: Secondary | ICD-10-CM | POA: Insufficient documentation

## 2022-03-09 DIAGNOSIS — T7840XA Allergy, unspecified, initial encounter: Secondary | ICD-10-CM | POA: Insufficient documentation

## 2022-03-09 DIAGNOSIS — M2391 Unspecified internal derangement of right knee: Secondary | ICD-10-CM | POA: Insufficient documentation

## 2022-06-23 DIAGNOSIS — Z1589 Genetic susceptibility to other disease: Secondary | ICD-10-CM | POA: Insufficient documentation

## 2022-08-30 ENCOUNTER — Other Ambulatory Visit (HOSPITAL_BASED_OUTPATIENT_CLINIC_OR_DEPARTMENT_OTHER): Payer: Self-pay

## 2022-08-30 MED ORDER — COMIRNATY 30 MCG/0.3ML IM SUSY
PREFILLED_SYRINGE | INTRAMUSCULAR | 0 refills | Status: DC
  Filled 2022-08-30: qty 0.3, 1d supply, fill #0

## 2022-09-02 DIAGNOSIS — S83281A Other tear of lateral meniscus, current injury, right knee, initial encounter: Secondary | ICD-10-CM | POA: Insufficient documentation

## 2022-12-21 DIAGNOSIS — M19042 Primary osteoarthritis, left hand: Secondary | ICD-10-CM | POA: Insufficient documentation

## 2023-08-31 ENCOUNTER — Other Ambulatory Visit (HOSPITAL_BASED_OUTPATIENT_CLINIC_OR_DEPARTMENT_OTHER): Payer: Self-pay

## 2023-08-31 MED ORDER — INFLUENZA VAC A&B SURF ANT ADJ 0.5 ML IM SUSY
0.5000 mL | PREFILLED_SYRINGE | Freq: Once | INTRAMUSCULAR | 0 refills | Status: AC
  Filled 2023-08-31: qty 0.5, 1d supply, fill #0

## 2023-09-13 DIAGNOSIS — M67471 Ganglion, right ankle and foot: Secondary | ICD-10-CM | POA: Insufficient documentation

## 2024-05-07 ENCOUNTER — Encounter (HOSPITAL_BASED_OUTPATIENT_CLINIC_OR_DEPARTMENT_OTHER): Payer: Self-pay

## 2024-05-07 ENCOUNTER — Emergency Department (HOSPITAL_BASED_OUTPATIENT_CLINIC_OR_DEPARTMENT_OTHER)

## 2024-05-07 ENCOUNTER — Emergency Department (HOSPITAL_BASED_OUTPATIENT_CLINIC_OR_DEPARTMENT_OTHER)
Admission: EM | Admit: 2024-05-07 | Discharge: 2024-05-07 | Disposition: A | Discharge status: Home / Self Care | Attending: Emergency Medicine | Admitting: Emergency Medicine

## 2024-05-07 ENCOUNTER — Other Ambulatory Visit: Payer: Self-pay

## 2024-05-07 DIAGNOSIS — R791 Abnormal coagulation profile: Secondary | ICD-10-CM | POA: Insufficient documentation

## 2024-05-07 DIAGNOSIS — R42 Dizziness and giddiness: Secondary | ICD-10-CM | POA: Diagnosis not present

## 2024-05-07 DIAGNOSIS — R0602 Shortness of breath: Secondary | ICD-10-CM | POA: Diagnosis not present

## 2024-05-07 DIAGNOSIS — R0781 Pleurodynia: Secondary | ICD-10-CM | POA: Insufficient documentation

## 2024-05-07 DIAGNOSIS — Z720 Tobacco use: Secondary | ICD-10-CM | POA: Diagnosis not present

## 2024-05-07 DIAGNOSIS — R079 Chest pain, unspecified: Secondary | ICD-10-CM

## 2024-05-07 LAB — COMPREHENSIVE METABOLIC PANEL WITH GFR
ALT: 15 U/L (ref 0–44)
AST: 27 U/L (ref 15–41)
Albumin: 4.2 g/dL (ref 3.5–5.0)
Alkaline Phosphatase: 90 U/L (ref 38–126)
Anion gap: 10 (ref 5–15)
BUN: 18 mg/dL (ref 8–23)
CO2: 28 mmol/L (ref 22–32)
Calcium: 9 mg/dL (ref 8.9–10.3)
Chloride: 103 mmol/L (ref 98–111)
Creatinine, Ser: 0.65 mg/dL (ref 0.44–1.00)
GFR, Estimated: >60 mL/min (ref >60)
Glucose, Bld: 119 mg/dL — ABNORMAL HIGH (ref 70–99)
Potassium: 3.8 mmol/L (ref 3.5–5.1)
Sodium: 141 mmol/L (ref 135–145)
Total Bilirubin: 0.2 mg/dL (ref 0.0–1.2)
Total Protein: 7.1 g/dL (ref 6.5–8.1)

## 2024-05-07 LAB — CBC WITH DIFFERENTIAL/PLATELET
Abs Immature Granulocytes: 0.02 10*3/uL (ref 0.00–0.07)
Basophils Absolute: 0.1 10*3/uL (ref 0.0–0.1)
Basophils Relative: 1 %
Eosinophils Absolute: 0.1 10*3/uL (ref 0.0–0.5)
Eosinophils Relative: 1 %
HCT: 45.1 % (ref 36.0–46.0)
Hemoglobin: 14.7 g/dL (ref 12.0–15.0)
Immature Granulocytes: 0 %
Lymphocytes Relative: 20 %
Lymphs Abs: 1.2 10*3/uL (ref 0.7–4.0)
MCH: 30.8 pg (ref 26.0–34.0)
MCHC: 32.6 g/dL (ref 30.0–36.0)
MCV: 94.5 fL (ref 80.0–100.0)
Monocytes Absolute: 0.4 10*3/uL (ref 0.1–1.0)
Monocytes Relative: 6 %
Neutro Abs: 4.6 10*3/uL (ref 1.7–7.7)
Neutrophils Relative %: 72 %
Platelets: 304 10*3/uL (ref 150–400)
RBC: 4.77 MIL/uL (ref 3.87–5.11)
RDW: 12.5 % (ref 11.5–15.5)
WBC: 6.3 10*3/uL (ref 4.0–10.5)
nRBC: 0 % (ref 0.0–0.2)

## 2024-05-07 LAB — TROPONIN T, HIGH SENSITIVITY
Troponin T High Sensitivity: <15 ng/L (ref <19)
Troponin T High Sensitivity: <15 ng/L (ref <19)

## 2024-05-07 LAB — D-DIMER, QUANTITATIVE: D-Dimer, Quant: 0.97 ug{FEU}/mL — ABNORMAL HIGH (ref 0.00–0.50)

## 2024-05-07 MED ORDER — IOHEXOL 350 MG/ML SOLN
80.0000 mL | Freq: Once | INTRAVENOUS | Status: AC | PRN
  Administered 2024-05-07: 80 mL via INTRAVENOUS

## 2024-05-07 MED ORDER — ASPIRIN 325 MG PO TBEC
325.0000 mg | DELAYED_RELEASE_TABLET | Freq: Once | ORAL | Status: AC
  Administered 2024-05-07: 325 mg via ORAL
  Filled 2024-05-07: qty 1

## 2024-05-07 NOTE — ED Provider Notes (Signed)
 [facility] AT [facility] Provider Note   CSN: [id number] Arrival date & time: 05/07/24  1209     Patient presents with: Chest Pain   Kaylee Costa is a 78 y.o. female with no significant past medical history presents the ED today for chest pain.  Patient reports that she was talking on the phone today when she developed pain that went across her lower ribs, at her bra line.  States that it felt like a squeezing sensation and radiated upper chest to her jaw and right shoulder.  Had associated dizziness and some shortness of breath.  Denies any prior cardiac history but endorses family history of cardiac disease.  By the time patient was roomed, she reports improvement of chest pain but said it is still there slightly.  She has not taken anything for her chest pain prior to arrival.    Prior to Admission medications   Medication Sig Start Date End Date Taking? Authorizing Provider  COVID-19 mRNA bivalent vaccine, Pfizer, (PFIZER COVID-19 VAC BIVALENT) injection Inject into the muscle. 09/30/21   Luiz Channel, MD  COVID-19 mRNA Vac-TriS, Pfizer, (PFIZER-BIONT COVID-19 VAC-TRIS) SUSP injection Inject into the muscle. 02/26/21   Luiz Channel, MD  COVID-19 mRNA vaccine [phone] (COMIRNATY ) syringe Inject into the muscle. 08/30/22   Luiz Channel, MD  ibuprofen  (ADVIL ,MOTRIN ) 200 MG tablet Take 200 mg by mouth every 6 (six) hours as needed. Patient used this medication for the pain in her shoulder.    [provider]  ibuprofen  (ADVIL ,MOTRIN ) 600 MG tablet Take 1 tablet (600 mg total) by mouth every 8 (eight) hours as needed. 08/06/18   Armenta Canning, MD  linaclotide (LINZESS) 145 MCG CAPS capsule Take 145 mcg by mouth daily before breakfast.    [provider]    Allergies: Morphine and codeine and Vicodin [hydrocodone-acetaminophen]    Review of Systems  Cardiovascular:  Positive for chest pain.  All other systems reviewed and are  negative.   Updated Vital Signs BP (!) 140/74   Pulse 64   Temp 97.8 F (36.6 C) (Oral)   Resp 10   Ht 5' 7 (1.702 m)   Wt 67.1 kg   SpO2 99%   BMI 23.18 kg/m   Physical Exam Vitals and nursing note reviewed.  Constitutional:      General: She is not in acute distress.    Appearance: Normal appearance.  HENT:     Head: Normocephalic and atraumatic.     Mouth/Throat:     Mouth: Mucous membranes are moist.   Eyes:     Conjunctiva/sclera: Conjunctivae normal.     Pupils: Pupils are equal, round, and reactive to light.    Cardiovascular:     Rate and Rhythm: Normal rate and regular rhythm.     Pulses: Normal pulses.     Heart sounds: Normal heart sounds.  Pulmonary:     Effort: Pulmonary effort is normal.     Breath sounds: Normal breath sounds.  Abdominal:     Palpations: Abdomen is soft.     Tenderness: There is no abdominal tenderness.   Musculoskeletal:        General: No tenderness. Normal range of motion.     Cervical back: Normal range of motion.   Skin:    General: Skin is warm and dry.     Findings: No rash.   Neurological:     General: No focal deficit present.     Mental Status: She is alert.  Psychiatric:        Mood and Affect: Mood normal.        Behavior: Behavior normal.     (all labs ordered are listed, but only abnormal results are displayed) Labs Reviewed  COMPREHENSIVE METABOLIC PANEL WITH GFR - Abnormal; Notable for the following components:      Result Value   Glucose, Bld 119 (*)    All other components within normal limits  D-DIMER, QUANTITATIVE - Abnormal; Notable for the following components:   D-Dimer, Quant 0.97 (*)    All other components within normal limits  CBC WITH DIFFERENTIAL/PLATELET  TROPONIN T, HIGH SENSITIVITY  TROPONIN T, HIGH SENSITIVITY    EKG: EKG Interpretation Date/Time:  Monday May 07 2024 12:22:07 EDT Ventricular Rate:  70 PR Interval:  156 QRS Duration:  101 QT Interval:  407 QTC  Calculation: 440 R Axis:   69  Text Interpretation: Sinus rhythm when compared to prior, similar appearance with less wandering baseline no STEMI Confirmed by Ginger Barefoot ([id number]) on 05/07/2024 12:24:29 PM  Radiology: CT Angio Chest PE W and/or Wo Contrast Result Date: 05/07/2024 CLINICAL DATA:  Concern for pulmonary embolism. Sudden onset chest pain. Substernal chest pain. EXAM: CT ANGIOGRAPHY CHEST WITH CONTRAST TECHNIQUE: Multidetector CT imaging of the chest was performed using the standard protocol during bolus administration of intravenous contrast. Multiplanar CT image reconstructions and MIPs were obtained to evaluate the vascular anatomy. RADIATION DOSE REDUCTION: This exam was performed according to the departmental dose-optimization program which includes automated exposure control, adjustment of the mA and/or kV according to patient size and/or use of iterative reconstruction technique. CONTRAST:  80mL OMNIPAQUE IOHEXOL 350 MG/ML SOLN COMPARISON:  None Available. FINDINGS: Cardiovascular: No filling defects within the pulmonary arteries to suggest acute pulmonary embolism. Mediastinum/Nodes: No axillary or supraclavicular adenopathy. No mediastinal or hilar adenopathy. No pericardial fluid. Esophagus normal. Lungs/Pleura: No pulmonary infarction. No pneumonia. No pleural fluid. No pneumothorax Upper Abdomen: Limited view of the liver, kidneys, pancreas are unremarkable. Normal adrenal glands. Musculoskeletal: No aggressive osseous lesion. Review of the MIP images confirms the above findings. IMPRESSION: 1. No evidence acute pulmonary embolism. 2. No acute pulmonary findings. Electronically Signed   By: Jackquline Boxer M.D.   On: 05/07/2024 14:20     Procedures   Medications Ordered in the ED  aspirin EC tablet 325 mg (325 mg Oral Given 05/07/24 1232)  iohexol (OMNIPAQUE) 350 MG/ML injection 80 mL (80 mLs Intravenous Contrast Given 05/07/24 1315)                                     Medical Decision Making Amount and/or Complexity of Data Reviewed Labs: ordered. Radiology: ordered.  Risk OTC drugs. Prescription drug management.   This patient presents to the ED for concern of chest pain, this involves an extensive number of treatment options, and is a complaint that carries with it a high risk of complications and morbidity.   Differential diagnosis includes: ACS, PE, costochondritis, pleurisy, muscle strain, anxiety, etc.   Comorbidities  No significant past medical history   Additional History  Additional history obtained from prior records   Cardiac Monitoring / EKG  The patient was maintained on a cardiac monitor.  I personally viewed and interpreted the cardiac monitored which showed: sinus rhythm with a heart rate of 70 bpm.   Lab Tests  I ordered and personally interpreted labs.  The pertinent results include:  CMP and CBC are reassuring D-dimer is elevated at 0.97 Initial troponin <15, delta troponin <15 as well   Imaging Studies  I ordered imaging studies including CTA PE study  I independently visualized and interpreted imaging which showed:  No evidence of acute pulmonary embolism.  No acute pulmonary findings. I agree with the radiologist interpretation   Problem List / ED Course / Critical Interventions / Medication Management  Patient reports that today around 11:40 AM she was on the phone with her nephew when she started to have a squeezing pain that wrapped around her lower ribs that radiated up her chest to her neck as well as the right shoulder.  Felt like her throat was tight because of the pain.  Had associated dizziness and shortness of breath.  States that she had a similar episode in 2008, but today that symptoms were worse. Symptoms improved while she was in triage today.  Denies any personal cardiac history but does have family history of heart disease. States that she did travel to [place] last month.  Denies any  recent surgeries or hospitalizations.  No new or worsening lower extremity edema or pain. Did not take anything for symptoms prior to arrival. I ordered medications including: Aspirin for chest pain  Reevaluation of the patient after these medicines showed that the patient improved Ambulatory referral for cardiology sent for reevaluation.   Social Determinants of Health  History of tobacco use   Test / Admission - Considered  Discussed findings with patient.  All questions answered. She is stable and safe for discharge home. Return precautions given.    Final diagnoses:  Chest pain, unspecified type    ED Discharge Orders          Ordered    Ambulatory referral to Cardiology       Comments: If you have not heard from the Cardiology office within the next 72 hours please call [phone].   05/07/24 1518               Waddell Sluder, PA-C 05/07/24 1544    Tegeler, Lonni PARAS, MD 05/08/24 [id number]

## 2024-05-07 NOTE — Discharge Instructions (Signed)
 As discussed, your labs and imaging are reassuring.  No acute findings as to what caused your chest pain today.  I have sent a referral to cardiology.  They will call you in the next several days to schedule an appointment.  Get help right away if: Your chest pain gets worse. You have a cough that gets worse, or you cough up blood. You have severe pain in your abdomen. You faint. You have sudden, unexplained chest discomfort. You have sudden, unexplained discomfort in your arms, back, neck, or jaw. You have shortness of breath at any time. You suddenly start to sweat, or your skin gets clammy. You feel nausea or you vomit. You suddenly feel lightheaded or dizzy. You have severe weakness, or unexplained weakness or fatigue. Your heart begins to beat quickly, or it feels like it is skipping beats.

## 2024-05-07 NOTE — ED Triage Notes (Addendum)
 Pt presents with sudden onset of CP while she was talking on the phone. Pain is located sub sternally and described as crushing with radiation into her jaw. Pt also felt a tightness in her throat and dizziness. Pt has some associated ShOB.   In triage pt was noted to have a rapid an irregular heart gallop on auscultation. Pt was taken directly to room and pt then experienced a reduction of pain. By the time the EKG was applied pt was in NSR and heart sounds returned to a RRR.

## 2024-05-07 NOTE — ED Notes (Signed)
 ED Provider at bedside.

## 2024-05-15 ENCOUNTER — Other Ambulatory Visit: Payer: Self-pay | Admitting: *Deleted

## 2024-05-15 ENCOUNTER — Encounter: Payer: Self-pay | Admitting: *Deleted

## 2024-05-16 ENCOUNTER — Other Ambulatory Visit: Payer: Self-pay | Admitting: Cardiology

## 2024-05-16 ENCOUNTER — Ambulatory Visit: Attending: Cardiology | Admitting: Cardiology

## 2024-05-16 ENCOUNTER — Telehealth (HOSPITAL_COMMUNITY): Payer: Self-pay | Admitting: *Deleted

## 2024-05-16 ENCOUNTER — Encounter: Payer: Self-pay | Admitting: Cardiology

## 2024-05-16 VITALS — BP 120/80 | HR 63 | Ht 67.0 in | Wt 148.0 lb

## 2024-05-16 DIAGNOSIS — R079 Chest pain, unspecified: Secondary | ICD-10-CM | POA: Insufficient documentation

## 2024-05-16 MED ORDER — NITROGLYCERIN 0.4 MG SL SUBL: 0.4000 mg | SUBLINGUAL_TABLET | SUBLINGUAL | 1 refills | Status: AC | PRN

## 2024-05-16 NOTE — Telephone Encounter (Signed)
 Patient given detailed instructions per Myocardial Perfusion Study Information Sheet for the test on 05/21/2024 at 10:45. Patient notified to arrive 15 minutes early and that it is imperative to arrive on time for appointment to keep from having the test rescheduled.  If you need to cancel or reschedule your appointment, please call the office within 24 hours of your appointment. . Patient verbalized understanding.Kaylee Costa

## 2024-05-16 NOTE — Progress Notes (Signed)
 Cardiology Office Note:    Date:  05/16/2024   ID:  Kaylee Costa, DOB 02/02/1946, MRN [id number]  PCP:  Ziglar, Samantha P, NP  Cardiologist:  Jennifer JONELLE Crape, MD   Referring MD: Waddell Sluder, PA-C    ASSESSMENT:    1. Chest pain of uncertain etiology    PLAN:    In order of problems listed above:  Primary prevention stressed with the patient.  Importance of compliance with diet medication stressed and patient verbalized standing. She has exercise tolerance which is excellent. Cardiac murmur: Echocardiogram will be done to assess murmur heard on auscultation. Chest pain: Atypical in nature but in view of her apprehension and risk factors we will do an exercise stress Cardiolite.  She is agreeable.  Further recommendations will be made based on the findings of this test. Elevated lipids: She tells me that her lipids were elevated and she is doing her best to diet and is under the care of primary care for this.  Lipid lowering and dose targets will be decided after the results of the aforementioned tests. Sublingual nitroglycerin prescription was sent, its protocol and 911 protocol explained and the patient vocalized understanding questions were answered to the patient's satisfaction Patient will be seen in follow-up appointment in 6 months or earlier if the patient has any concerns.    Medication Adjustments/Labs and Tests Ordered: Current medicines are reviewed at length with the patient today.  Concerns regarding medicines are outlined above.  Orders Placed This Encounter  Procedures   MYOCARDIAL PERFUSION IMAGING   ECHOCARDIOGRAM COMPLETE   Meds ordered this encounter  Medications   nitroGLYCERIN (NITROSTAT) 0.4 MG SL tablet    Sig: Place 1 tablet (0.4 mg total) under the tongue every 5 (five) minutes as needed for chest pain.    Dispense:  25 tablet    Refill:  1     History of Present Illness:    Kaylee Costa is a 78 y.o. female who is being seen today for the  evaluation of chest pain at the request of Waddell Sluder, PA-C.  Patient is a pleasant 78 year old female.  She has no significant past medical history.  She actually exercises on a regular basis.  She walks about 6 miles tomorrow.  Days splitting it into 2 miles.  With this she has no symptoms.  She was talking on the phone and she had substernal chest pain with no radiation.  This was part of a stressful phone call.  Therefore she went to the emergency room.  States she was evaluated and released.  I reviewed those records.  At the time of my evaluation, the patient is alert awake oriented and in no distress.  Past Medical History:  Diagnosis Date   Chronic idiopathic constipation     Past Surgical History:  Procedure Laterality Date   CHOLECYSTECTOMY     TONSILLECTOMY     VAGINAL HYSTERECTOMY      Current Medications: Current Meds  Medication Sig   Calcium Carbonate-Vit D-Min (CALCIUM 1200 PO) Take 1,200 mg by mouth daily.   Cholecalciferol 125 MCG (5000 UT) TABS Take 1 tablet by mouth every morning.   fluticasone (FLONASE) 50 MCG/ACT nasal spray Place 1 spray into both nostrils daily as needed for allergies or rhinitis.   ibuprofen  (ADVIL ,MOTRIN ) 600 MG tablet Take 1 tablet (600 mg total) by mouth every 8 (eight) hours as needed.   linaclotide (LINZESS) 145 MCG CAPS capsule Take 145 mcg by mouth daily before breakfast.  nitroGLYCERIN (NITROSTAT) 0.4 MG SL tablet Place 1 tablet (0.4 mg total) under the tongue every 5 (five) minutes as needed for chest pain.     Allergies:   Butorphanol, Meperidine hcl, Morphine and codeine, and Vicodin [hydrocodone-acetaminophen]   Social History   Socioeconomic History   Marital status: Divorced    Spouse name: Not on file   Number of children: Not on file   Years of education: Not on file   Highest education level: Not on file  Occupational History   Not on file  Tobacco Use   Smoking status: Former   Smokeless tobacco: Never  Vaping Use    Vaping status: Never Used  Substance and Sexual Activity   Alcohol use: Yes    Comment: weekends   Drug use: No   Sexual activity: Not on file  Other Topics Concern   Not on file  Social History Narrative   Not on file   Social Drivers of Health   Financial Resource Strain: Low Risk  (04/29/2024)   Received from [facility]   Overall Financial Resource Strain (CARDIA)    Difficulty of Paying Living Expenses: Not hard at all  Food Insecurity: No Food Insecurity (04/29/2024)   [facility] from [facility]   Hunger Vital Sign    Within the past 12 months, you worried that your food would run out before you got the money to buy more.: Never true    Within the past 12 months, the food you bought just didn't last and you didn't have money to get more.: Never true  Transportation Needs: No Transportation Needs (04/29/2024)   Received from [facility] - Transportation    Lack of Transportation (Medical): No    Lack of Transportation (Non-Medical): No  Physical Activity: Sufficiently Active (04/29/2024)   Received from [facility]   Exercise Vital Sign    On average, how many days per week do you engage in moderate to strenuous exercise (like a brisk walk)?: 7 days    On average, how many minutes do you engage in exercise at this level?: 60 min  Stress: No Stress Concern Present (04/29/2024)   Received from [facility] - Occupational Stress Questionnaire    Feeling of Stress : Not at all  Social Connections: Socially Integrated (04/29/2024)   Received from [facility]   Social Network    How would you rate your social network (family, work, friends)?: Good participation with social networks     Family History: The patient's family history is not on file.  ROS:   Please see the history of present illness.    All other systems reviewed and are negative.  EKGs/Labs/Other Studies Reviewed:    The following studies were  reviewed today: EKG was unremarkable from the emergency room.       Recent Labs: 05/07/2024: ALT 15; BUN 18; Creatinine, Ser 0.65; Hemoglobin 14.7; Platelets 304; Potassium 3.8; Sodium 141  Recent Lipid Panel No results found for: CHOL, TRIG, HDL, CHOLHDL, VLDL, LDLCALC, LDLDIRECT  Physical Exam:    VS:  BP 120/80   Pulse 63   Ht 5' 7 (1.702 m)   Wt 148 lb (67.1 kg)   SpO2 97%   BMI 23.18 kg/m     Wt Readings from Last 3 Encounters:  05/16/24 148 lb (67.1 kg)  05/07/24 148 lb (67.1 kg)  08/06/18 155 lb (70.3 kg)     GEN: Patient is in no  acute distress HEENT: Normal NECK: No JVD; No carotid bruits LYMPHATICS: No lymphadenopathy CARDIAC: S1 S2 regular, 2/6 systolic murmur at the apex. RESPIRATORY:  Clear to auscultation without rales, wheezing or rhonchi  ABDOMEN: Soft, non-tender, non-distended MUSCULOSKELETAL:  No edema; No deformity  SKIN: Warm and dry NEUROLOGIC:  Alert and oriented x 3 PSYCHIATRIC:  Normal affect    Signed, Jennifer JONELLE Crape, MD  05/16/2024 10:02 AM    [facility]

## 2024-05-16 NOTE — Patient Instructions (Addendum)
 Medication Instructions:  Your physician has recommended you make the following change in your medication:   START: Nitroglycerin 0.4 mg under the tongue every 5 minutes x 3 doses as needed for chest pain  *If you need a refill on your cardiac medications before your next appointment, please call your pharmacy*  Lab Work: None If you have labs (blood work) drawn today and your tests are completely normal, you will receive your results only by: MyChart Message (if you have MyChart) OR A paper copy in the mail If you have any lab test that is abnormal or we need to change your treatment, we will call you to review the results.  Testing/Procedures: Your physician has requested that you have an echocardiogram. Echocardiography is a painless test that uses sound waves to create images of your heart. It provides your doctor with information about the size and shape of your heart and how well your heart's chambers and valves are working. This procedure takes approximately one hour. There are no restrictions for this procedure. Please do NOT wear cologne, perfume, aftershave, or lotions (deodorant is allowed). Please arrive 15 minutes prior to your appointment time.  Please note: We ask at that you not bring children with you during ultrasound (echo/ vascular) testing. Due to room size and safety concerns, children are not allowed in the ultrasound rooms during exams. Our front office staff cannot provide observation of children in our lobby area while testing is being conducted. An adult accompanying a patient to their appointment will only be allowed in the ultrasound room at the discretion of the ultrasound technician under special circumstances. We apologize for any inconvenience.    [facility] Cardiovascular Imaging at [facility] [address] [place], [place] [address] Phone: [phone]    Please arrive 15 minutes prior to your appointment time for registration and insurance  purposes.  The test will take approximately 3 to 4 hours to complete; you may bring reading material.  If someone comes with you to your appointment, they will need to remain in the main lobby due to limited space in the testing area. **If you are pregnant or breastfeeding, please notify the nuclear lab prior to your appointment**  How to prepare for your Myocardial Perfusion Test: Do not eat or drink 3 hours prior to your test, except you may have water. Do not consume products containing caffeine (regular or decaffeinated) 12 hours prior to your test. (ex: coffee, chocolate, sodas, tea). Do bring a list of your current medications with you.  If not listed below, you may take your medications as normal. Do wear comfortable clothes (no dresses or overalls) and walking shoes, tennis shoes preferred (No heels or open toe shoes are allowed). Do NOT wear cologne, perfume, aftershave, or lotions (deodorant is allowed). If these instructions are not followed, your test will have to be rescheduled.  Please report to [address] ([facility] 2nd Floor, for your test.  If you have questions or concerns about your appointment, you can call the Nuclear Lab at [phone].  If you cannot keep your appointment, please provide 24 hours notification to the Nuclear Lab, to avoid a possible $50 charge to your account.   Follow-Up: At [facility], you and your health needs are our priority.  As part of our continuing mission to provide you with exceptional heart care, our providers are all part of one team.  This team includes your primary Cardiologist (physician)  and Advanced Practice Providers or APPs (Physician Assistants and Nurse Practitioners) who all work together to provide you with the care you need, when you need it.  Your next appointment:   9 month(s)  Provider:   Jennifer Crape, MD    We recommend signing up for the patient portal  called MyChart.  Sign up information is provided on this After Visit Summary.  MyChart is used to connect with patients for Virtual Visits (Telemedicine).  Patients are able to view lab/test results, encounter notes, upcoming appointments, etc.  Non-urgent messages can be sent to your provider as well.   To learn more about what you can do with MyChart, go to [email or web address].   Other Instructions None

## 2024-05-17 ENCOUNTER — Ambulatory Visit (HOSPITAL_BASED_OUTPATIENT_CLINIC_OR_DEPARTMENT_OTHER)
Admission: RE | Admit: 2024-05-17 | Discharge: 2024-05-17 | Disposition: A | Discharge status: Home / Self Care | Source: Ambulatory Visit | Attending: Cardiology | Admitting: Cardiology

## 2024-05-17 DIAGNOSIS — R079 Chest pain, unspecified: Secondary | ICD-10-CM | POA: Diagnosis present

## 2024-05-18 LAB — ECHOCARDIOGRAM COMPLETE
AR max vel: 2.45 cm2
AV Area VTI: 2.32 cm2
AV Area mean vel: 2.36 cm2
AV Mean grad: 4 mmHg
AV Peak grad: 6.6 mmHg
Ao pk vel: 1.28 m/s
Area-P 1/2: 1.98 cm2
Calc EF: 71.8 %
S' Lateral: 2.1 cm
Single Plane A2C EF: 74 %
Single Plane A4C EF: 67.9 %

## 2024-05-21 ENCOUNTER — Ambulatory Visit (HOSPITAL_COMMUNITY)
Admission: RE | Admit: 2024-05-21 | Discharge: 2024-05-21 | Disposition: A | Discharge status: Home / Self Care | Source: Ambulatory Visit | Attending: Cardiology | Admitting: Cardiology

## 2024-05-21 DIAGNOSIS — R079 Chest pain, unspecified: Secondary | ICD-10-CM | POA: Insufficient documentation

## 2024-05-21 LAB — MYOCARDIAL PERFUSION IMAGING
Angina Index: 0
Duke Treadmill Score: -4
Estimated workload: 7
Exercise duration (min): 6 min
LV dias vol: 64 mL (ref 46–106)
LV sys vol: 14 mL (ref 3.8–5.2)
MPHR: 144 {beats}/min
Nuc Stress EF: 78 %
Peak HR: 150 {beats}/min
Percent HR: 104 %
Rest HR: 70 {beats}/min
Rest Nuclear Isotope Dose: 10.3 mCi
SDS: 0
SRS: 2
SSS: 0
ST Depression (mm): 2 mm
Stress Nuclear Isotope Dose: 31.7 mCi
TID: 0.92

## 2024-05-21 MED ORDER — TECHNETIUM TC 99M TETROFOSMIN IV KIT
31.7000 | PACK | Freq: Once | INTRAVENOUS | Status: AC | PRN
  Administered 2024-05-21: 31.7 via INTRAVENOUS

## 2024-05-21 MED ORDER — TECHNETIUM TC 99M TETROFOSMIN IV KIT
10.3000 | PACK | Freq: Once | INTRAVENOUS | Status: AC | PRN
  Administered 2024-05-21: 10.3 via INTRAVENOUS

## 2024-05-22 ENCOUNTER — Ambulatory Visit: Payer: Self-pay | Admitting: Cardiology

## 2024-06-05 ENCOUNTER — Encounter: Payer: Self-pay | Admitting: Cardiology

## 2024-06-05 ENCOUNTER — Ambulatory Visit: Attending: Cardiology | Admitting: Cardiology

## 2024-06-05 ENCOUNTER — Ambulatory Visit: Attending: Cardiology

## 2024-06-05 VITALS — BP 128/80 | HR 70 | Ht 68.0 in | Wt 149.1 lb

## 2024-06-05 DIAGNOSIS — R002 Palpitations: Secondary | ICD-10-CM | POA: Diagnosis not present

## 2024-06-05 NOTE — Progress Notes (Signed)
 Cardiology Office Note:    Date:  06/05/2024   ID:  Kaylee Costa, DOB [birth year], MRN [id number]  PCP:  Ziglar, Samantha P, NP  Cardiologist:  Jennifer JONELLE Crape, MD   Referring MD: Ziglar, Samantha P, NP    ASSESSMENT:    1. Palpitations    PLAN:    In order of problems listed above:  Primary prevention stressed with the patient.  Importance of compliance with diet medication stressed and patient verbalized standing. Palpitations: I discussed this with the patient at length and the stress test report.  I discussed a 2-week monitoring and she is agreeable.  This will help us  understand to see if she has any arrhythmia.  She has an excellent effort tolerance and does very well with exercise. She complained of some pain in the right lower extremity.  I checked it thoroughly and compared it with the left lower extremity and it was unremarkable.  And especially with her walking this morning with no symptoms I reassured her.  I told her to seek medical evaluation should this recur. Patient will be seen in follow-up appointment in 12 months or earlier if the patient has any concerns.    Medication Adjustments/Labs and Tests Ordered: Current medicines are reviewed at length with the patient today.  Concerns regarding medicines are outlined above.  No orders of the defined types were placed in this encounter.  No orders of the defined types were placed in this encounter.    No chief complaint on file.    History of Present Illness:    Kaylee Costa is a 78 y.o. female.  Patient was evaluated by me for chest pain.  Stress test and echo was unremarkable and low risk.  Patient gives history of palpitations at times not related to exertion.  It is more of a flip-flop feeling in the heart.  No dizziness no syncope.  Stress test revealed a possible suspicion of atrial fibrillation.  At the time of my evaluation, the patient is alert awake oriented and in no distress.  She walks on a regular  basis and has walked 3 and half miles with out any problems this morning  Past Medical History:  Diagnosis Date   Chronic idiopathic constipation     Past Surgical History:  Procedure Laterality Date   CHOLECYSTECTOMY     TONSILLECTOMY     VAGINAL HYSTERECTOMY      Current Medications: Current Meds  Medication Sig   Calcium Carbonate-Vit D-Min (CALCIUM 1200 PO) Take 1,200 mg by mouth daily.   Cholecalciferol 125 MCG (5000 UT) TABS Take 1 tablet by mouth every morning.   fluticasone (FLONASE) 50 MCG/ACT nasal spray Place 1 spray into both nostrils daily as needed for allergies or rhinitis.   ibuprofen  (ADVIL ,MOTRIN ) 600 MG tablet Take 1 tablet (600 mg total) by mouth every 8 (eight) hours as needed.   linaclotide (LINZESS) 145 MCG CAPS capsule Take 145 mcg by mouth daily before breakfast.   nitroGLYCERIN  (NITROSTAT ) 0.4 MG SL tablet Place 1 tablet (0.4 mg total) under the tongue every 5 (five) minutes as needed for chest pain.     Allergies:   Butorphanol, Meperidine hcl, Morphine and codeine, and Vicodin [hydrocodone-acetaminophen]   Social History   Socioeconomic History   Marital status: Divorced    Spouse name: Not on file   Number of children: Not on file   Years of education: Not on file   Highest education level: Not on file  Occupational History   Not  on file  Tobacco Use   Smoking status: Former   Smokeless tobacco: Never  Vaping Use   Vaping status: Never Used  Substance and Sexual Activity   Alcohol use: Yes    Comment: weekends   Drug use: No   Sexual activity: Not on file  Other Topics Concern   Not on file  Social History Narrative   Not on file   Social Drivers of Health   Financial Resource Strain: Low Risk  (04/29/2024)   Received from [facility]   Overall Financial Resource Strain (CARDIA)    Difficulty of Paying Living Expenses: Not hard at all  Food Insecurity: No Food Insecurity (04/29/2024)   Received from [facility]   Hunger Vital  Sign    Within the past 12 months, you worried that your food would run out before you got the money to buy more.: Never true    Within the past 12 months, the food you bought just didn't last and you didn't have money to get more.: Never true  Transportation Needs: No Transportation Needs (04/29/2024)   Received from [facility] - Transportation    Lack of Transportation (Medical): No    Lack of Transportation (Non-Medical): No  Physical Activity: Sufficiently Active (04/29/2024)   Received from [facility]   Exercise Vital Sign    On average, how many days per week do you engage in moderate to strenuous exercise (like a brisk walk)?: 7 days    On average, how many minutes do you engage in exercise at this level?: 60 min  Stress: No Stress Concern Present (04/29/2024)   Received from [facility] Occupational Health - Occupational Stress Questionnaire    Feeling of Stress : Not at all  Social Connections: Socially Integrated (04/29/2024)   Received from [facility]   Social Network    How would you rate your social network (family, work, friends)?: Good participation with social networks     Family History: The patient's family history is not on file.  ROS:   Please see the history of present illness.    All other systems reviewed and are negative.  EKGs/Labs/Other Studies Reviewed:    The following studies were reviewed today: .SABRA   I discussed my findings with the patient at length   Recent Labs: 05/07/2024: ALT 15; BUN 18; Creatinine, Ser 0.65; Hemoglobin 14.7; Platelets 304; Potassium 3.8; Sodium 141  Recent Lipid Panel No results found for: CHOL, TRIG, HDL, CHOLHDL, VLDL, LDLCALC, LDLDIRECT  Physical Exam:    VS:  BP 128/80 (BP Location: Right Arm, Patient Position: Sitting, Cuff Size: Normal)   Pulse 70   Ht 5' 8 (1.727 m)   Wt 149 lb 1.3 oz (67.6 kg)   SpO2 94%   BMI 22.67 kg/m     Wt Readings from Last 3  Encounters:  06/05/24 149 lb 1.3 oz (67.6 kg)  05/16/24 148 lb (67.1 kg)  05/07/24 148 lb (67.1 kg)     GEN: Patient is in no acute distress HEENT: Normal NECK: No JVD; No carotid bruits LYMPHATICS: No lymphadenopathy CARDIAC: Hear sounds regular, 2/6 systolic murmur at the apex. RESPIRATORY:  Clear to auscultation without rales, wheezing or rhonchi  ABDOMEN: Soft, non-tender, non-distended MUSCULOSKELETAL:  No edema; No deformity  SKIN: Warm and dry NEUROLOGIC:  Alert and oriented x 3 PSYCHIATRIC:  Normal affect   Signed, Jennifer JONELLE Crape, MD  06/05/2024 11:20 AM    [facility]

## 2024-06-05 NOTE — Addendum Note (Signed)
 Addended by: Alassane Kalafut R on: 06/05/2024 11:22 AM   Modules accepted: Level of Service

## 2024-06-05 NOTE — Addendum Note (Signed)
 Addended by: MELIDA ROLIN HERO on: 06/05/2024 11:27 AM   Modules accepted: Orders

## 2024-06-05 NOTE — Patient Instructions (Addendum)
 Medication Instructions:  Your physician recommends that you continue on your current medications as directed. Please refer to the Current Medication list given to you today.  *If you need a refill on your cardiac medications before your next appointment, please call your pharmacy*  Testing/Procedures: ZIO XT- Long Term Monitor Instructions  Your physician has requested you wear a ZIO patch monitor for 14 days.  This is a single patch monitor. [employer] supplies one patch monitor per enrollment. Additional stickers are not available. Please do not apply patch if you will be having a Nuclear Stress Test,  Echocardiogram, Cardiac CT, MRI, or Chest Xray during the period you would be wearing the  monitor. The patch cannot be worn during these tests. You cannot remove and re-apply the  ZIO XT patch monitor.  Your ZIO patch monitor will be mailed 3 day USPS to your address on file. It may take 3-5 days  to receive your monitor after you have been enrolled.  Once you have received your monitor, please review the enclosed instructions. Your monitor  has already been registered assigning a specific monitor serial # to you.  Billing and Patient Assistance Program Information  We have supplied [employer] with any of your insurance information on file for billing purposes. [employer] offers a sliding scale Patient Assistance Program for patients that do not have  insurance, or whose insurance does not completely cover the cost of the ZIO monitor.  You must apply for the Patient Assistance Program to qualify for this discounted rate.  To apply, please call [employer] at [phone], select option 4, select option 2, ask to apply for  Patient Assistance Program. Meredeth will ask your household income, and how many people  are in your household. They will quote your out-of-pocket cost based on that information.  [employer] will also be able to set up a 35-month, interest-free payment plan if needed.  Applying the  monitor Do not shower for the first 24 hours. You may shower after the first 24 hours.  Press the button if you feel a symptom. You will hear a small click. Record Date, Time and  Symptom in the Patient Logbook.  When you are ready to remove the patch, follow instructions on the last 2 pages of Patient  Logbook. Stick patch monitor onto the last page of Patient Logbook.  Place Patient Logbook in the blue and white box. Use locking tab on box and tape box closed  securely. The blue and white box has prepaid postage on it. Please place it in the mailbox as  soon as possible. Your physician should have your test results approximately 7 days after the  monitor has been mailed back to [facility].  Call [facility] Customer Care at [phone] if you have questions regarding  your ZIO XT patch monitor. Call them immediately if you see an orange light blinking on your  monitor.  If your monitor falls off in less than 4 days, contact our Monitor department at [phone].  If your monitor becomes loose or falls off after 4 days call [employer] at [phone] for  suggestions on securing your monitor   Follow-Up: At [facility], you and your health needs are our priority.  As part of our continuing mission to provide you with exceptional heart care, our providers are all part of one team.  This team includes your primary Cardiologist (physician) and Advanced Practice Providers or APPs (Physician Assistants and Nurse Practitioners) who all work together to provide you with the  care you need, when you need it.  Your next appointment:   9 month(s)  Provider:   Jennifer Crape, MD

## 2024-07-05 ENCOUNTER — Ambulatory Visit: Payer: Self-pay | Admitting: Cardiology

## 2024-07-05 DIAGNOSIS — R002 Palpitations: Secondary | ICD-10-CM | POA: Diagnosis not present

## 2024-07-06 MED ORDER — METOPROLOL SUCCINATE ER 25 MG PO TB24: 25.0000 mg | ORAL_TABLET | Freq: Every day | ORAL | 3 refills | Status: AC

## 2024-07-06 NOTE — Telephone Encounter (Signed)
 MyChart message

## 2024-07-06 NOTE — Telephone Encounter (Signed)
-----   Message from Conetoe Revankar sent at 07/05/2024 12:53 PM EDT ----- Mildly abnormal.  May start metoprolol  succinate 25 mg in the morning.  Also get him in for a Chem-7 and magnesium level.  Copy primary Jennifer JONELLE Crape, MD 07/05/2024 12:52 PM  ----- Message ----- From: Crape Jennifer JONELLE, MD Sent: 07/05/2024  12:51 PM EDT To: Jennifer JONELLE Crape, MD

## 2024-07-10 LAB — BASIC METABOLIC PANEL WITH GFR
BUN/Creatinine Ratio: 23 (ref 12–28)
BUN: 17 mg/dL (ref 8–27)
CO2: 26 mmol/L (ref 20–29)
Calcium: 9.5 mg/dL (ref 8.7–10.3)
Chloride: 100 mmol/L (ref 96–106)
Creatinine, Ser: 0.74 mg/dL (ref 0.57–1.00)
Glucose: 88 mg/dL (ref 70–99)
Potassium: 4.7 mmol/L (ref 3.5–5.2)
Sodium: 141 mmol/L (ref 134–144)
eGFR: 84 mL/min/1.73 (ref >59)

## 2024-07-10 LAB — MAGNESIUM: Magnesium: 2.2 mg/dL (ref 1.6–2.3)

## 2024-07-11 ENCOUNTER — Telehealth: Payer: Self-pay | Admitting: Cardiology

## 2024-07-11 NOTE — Telephone Encounter (Signed)
 Calling to say they need tracing from patient heart monitor. Fax number [phone]. Please advise

## 2024-07-11 NOTE — Telephone Encounter (Signed)
Sent via EPIC fax

## 2024-07-20 ENCOUNTER — Encounter: Payer: Self-pay | Admitting: Cardiology

## 2024-08-01 ENCOUNTER — Other Ambulatory Visit (HOSPITAL_BASED_OUTPATIENT_CLINIC_OR_DEPARTMENT_OTHER): Payer: Self-pay

## 2024-08-01 MED ORDER — COMIRNATY 30 MCG/0.3ML IM SUSY
0.3000 mL | PREFILLED_SYRINGE | Freq: Once | INTRAMUSCULAR | 0 refills | Status: AC
Start: 2024-08-01 — End: 2024-08-02
  Filled 2024-08-01: qty 0.3, 1d supply, fill #0

## 2024-08-16 ENCOUNTER — Other Ambulatory Visit (HOSPITAL_BASED_OUTPATIENT_CLINIC_OR_DEPARTMENT_OTHER): Payer: Self-pay

## 2024-08-16 MED ORDER — FLUZONE HIGH-DOSE 0.5 ML IM SUSY
0.5000 mL | PREFILLED_SYRINGE | Freq: Once | INTRAMUSCULAR | 0 refills | Status: AC
  Filled 2024-08-16: qty 0.5, 1d supply, fill #0
